# Patient Record
Sex: Female | Born: 1955
Health system: Southern US, Community
[De-identification: ages and names within clinical notes are randomized; demographics above are authoritative.]

## PROBLEM LIST (undated history)

## (undated) DIAGNOSIS — M8580 Other specified disorders of bone density and structure, unspecified site: Secondary | ICD-10-CM

## (undated) DIAGNOSIS — N959 Unspecified menopausal and perimenopausal disorder: Secondary | ICD-10-CM

## (undated) DIAGNOSIS — R030 Elevated blood-pressure reading, without diagnosis of hypertension: Secondary | ICD-10-CM

## (undated) DIAGNOSIS — Z87898 Personal history of other specified conditions: Secondary | ICD-10-CM

## (undated) DIAGNOSIS — I499 Cardiac arrhythmia, unspecified: Secondary | ICD-10-CM

## (undated) DIAGNOSIS — R87629 Unspecified abnormal cytological findings in specimens from vagina: Secondary | ICD-10-CM

## (undated) DIAGNOSIS — N200 Calculus of kidney: Secondary | ICD-10-CM

## (undated) DIAGNOSIS — Z8669 Personal history of other diseases of the nervous system and sense organs: Secondary | ICD-10-CM

## (undated) HISTORY — DX: Calculus of kidney: N20.0

## (undated) HISTORY — PX: VAGINAL HYSTERECTOMY: SUR661

## (undated) HISTORY — DX: Unspecified menopausal and perimenopausal disorder: N95.9

## (undated) HISTORY — PX: BREAST BIOPSY: SHX20

## (undated) HISTORY — DX: Personal history of other diseases of the nervous system and sense organs: Z86.69

## (undated) HISTORY — DX: Other specified disorders of bone density and structure, unspecified site: M85.80

## (undated) HISTORY — DX: Personal history of other specified conditions: Z87.898

## (undated) HISTORY — DX: Elevated blood-pressure reading, without diagnosis of hypertension: R03.0

## (undated) HISTORY — DX: Unspecified abnormal cytological findings in specimens from vagina: R87.629

---

## 1997-12-18 ENCOUNTER — Ambulatory Visit (HOSPITAL_COMMUNITY): Admission: RE | Admit: 1997-12-18 | Discharge: 1997-12-18 | Payer: Self-pay | Admitting: *Deleted

## 1999-11-13 ENCOUNTER — Emergency Department (HOSPITAL_COMMUNITY): Admission: EM | Admit: 1999-11-13 | Discharge: 1999-11-13 | Payer: Self-pay | Admitting: Emergency Medicine

## 1999-11-13 ENCOUNTER — Encounter: Payer: Self-pay | Admitting: *Deleted

## 2000-07-28 ENCOUNTER — Other Ambulatory Visit: Admission: RE | Admit: 2000-07-28 | Discharge: 2000-07-28 | Payer: Self-pay | Admitting: Obstetrics & Gynecology

## 2001-01-22 ENCOUNTER — Encounter: Admission: RE | Admit: 2001-01-22 | Discharge: 2001-01-22 | Payer: Self-pay | Admitting: Obstetrics & Gynecology

## 2001-01-22 ENCOUNTER — Encounter: Payer: Self-pay | Admitting: Obstetrics & Gynecology

## 2003-02-07 ENCOUNTER — Other Ambulatory Visit: Admission: RE | Admit: 2003-02-07 | Discharge: 2003-02-07 | Payer: Self-pay | Admitting: Obstetrics & Gynecology

## 2004-11-27 ENCOUNTER — Other Ambulatory Visit: Admission: RE | Admit: 2004-11-27 | Discharge: 2004-11-27 | Payer: Self-pay | Admitting: Obstetrics & Gynecology

## 2004-12-26 ENCOUNTER — Encounter: Admission: RE | Admit: 2004-12-26 | Discharge: 2004-12-26 | Payer: Self-pay | Admitting: Obstetrics & Gynecology

## 2006-04-10 ENCOUNTER — Encounter: Admission: RE | Admit: 2006-04-10 | Discharge: 2006-04-10 | Payer: Self-pay | Admitting: Obstetrics & Gynecology

## 2007-04-20 ENCOUNTER — Encounter: Admission: RE | Admit: 2007-04-20 | Discharge: 2007-04-20 | Payer: Self-pay | Admitting: Obstetrics & Gynecology

## 2008-04-21 ENCOUNTER — Encounter: Admission: RE | Admit: 2008-04-21 | Discharge: 2008-04-21 | Payer: Self-pay | Admitting: Obstetrics & Gynecology

## 2008-06-30 ENCOUNTER — Emergency Department (HOSPITAL_BASED_OUTPATIENT_CLINIC_OR_DEPARTMENT_OTHER): Admission: EM | Admit: 2008-06-30 | Discharge: 2008-06-30 | Payer: Self-pay | Admitting: Emergency Medicine

## 2008-06-30 ENCOUNTER — Ambulatory Visit (HOSPITAL_BASED_OUTPATIENT_CLINIC_OR_DEPARTMENT_OTHER): Admission: RE | Admit: 2008-06-30 | Discharge: 2008-06-30 | Payer: Self-pay | Admitting: Family Medicine

## 2009-04-25 ENCOUNTER — Encounter: Admission: RE | Admit: 2009-04-25 | Discharge: 2009-04-25 | Payer: Self-pay | Admitting: Obstetrics & Gynecology

## 2010-04-30 ENCOUNTER — Encounter: Admission: RE | Admit: 2010-04-30 | Discharge: 2010-04-30 | Payer: Self-pay | Admitting: Obstetrics & Gynecology

## 2010-11-23 ENCOUNTER — Encounter: Payer: Self-pay | Admitting: Obstetrics & Gynecology

## 2011-05-08 ENCOUNTER — Other Ambulatory Visit: Payer: Self-pay | Admitting: Obstetrics & Gynecology

## 2011-05-08 DIAGNOSIS — Z1231 Encounter for screening mammogram for malignant neoplasm of breast: Secondary | ICD-10-CM

## 2011-06-06 ENCOUNTER — Ambulatory Visit
Admission: RE | Admit: 2011-06-06 | Discharge: 2011-06-06 | Disposition: A | Discharge status: Home / Self Care | Payer: BC Managed Care – PPO | Source: Ambulatory Visit | Attending: Obstetrics & Gynecology | Admitting: Obstetrics & Gynecology

## 2011-06-06 DIAGNOSIS — Z1231 Encounter for screening mammogram for malignant neoplasm of breast: Secondary | ICD-10-CM

## 2012-06-17 ENCOUNTER — Other Ambulatory Visit: Payer: Self-pay | Admitting: Obstetrics & Gynecology

## 2012-06-17 DIAGNOSIS — Z1231 Encounter for screening mammogram for malignant neoplasm of breast: Secondary | ICD-10-CM

## 2012-07-13 ENCOUNTER — Ambulatory Visit
Admission: RE | Admit: 2012-07-13 | Discharge: 2012-07-13 | Disposition: A | Discharge status: Home / Self Care | Payer: BC Managed Care – PPO | Source: Ambulatory Visit | Attending: Obstetrics & Gynecology | Admitting: Obstetrics & Gynecology

## 2012-07-13 DIAGNOSIS — Z1231 Encounter for screening mammogram for malignant neoplasm of breast: Secondary | ICD-10-CM

## 2013-06-14 ENCOUNTER — Other Ambulatory Visit: Payer: Self-pay

## 2013-06-14 DIAGNOSIS — Z1231 Encounter for screening mammogram for malignant neoplasm of breast: Secondary | ICD-10-CM

## 2013-07-25 ENCOUNTER — Ambulatory Visit: Payer: BC Managed Care – PPO

## 2013-07-28 ENCOUNTER — Ambulatory Visit
Admission: RE | Admit: 2013-07-28 | Discharge: 2013-07-28 | Disposition: A | Discharge status: Home / Self Care | Payer: BC Managed Care – PPO | Source: Ambulatory Visit

## 2013-07-28 DIAGNOSIS — Z1231 Encounter for screening mammogram for malignant neoplasm of breast: Secondary | ICD-10-CM

## 2013-08-08 ENCOUNTER — Other Ambulatory Visit: Payer: Self-pay | Admitting: Obstetrics & Gynecology

## 2013-08-08 DIAGNOSIS — R928 Other abnormal and inconclusive findings on diagnostic imaging of breast: Secondary | ICD-10-CM

## 2013-08-09 ENCOUNTER — Ambulatory Visit
Admission: RE | Admit: 2013-08-09 | Discharge: 2013-08-09 | Disposition: A | Discharge status: Home / Self Care | Payer: BC Managed Care – PPO | Source: Ambulatory Visit | Attending: Obstetrics & Gynecology | Admitting: Obstetrics & Gynecology

## 2013-08-09 DIAGNOSIS — R928 Other abnormal and inconclusive findings on diagnostic imaging of breast: Secondary | ICD-10-CM

## 2014-01-17 ENCOUNTER — Other Ambulatory Visit: Payer: Self-pay | Admitting: Obstetrics & Gynecology

## 2014-01-17 DIAGNOSIS — N6310 Unspecified lump in the right breast, unspecified quadrant: Secondary | ICD-10-CM

## 2014-02-13 ENCOUNTER — Ambulatory Visit
Admission: RE | Admit: 2014-02-13 | Discharge: 2014-02-13 | Disposition: A | Discharge status: Home / Self Care | Payer: BC Managed Care – PPO | Source: Ambulatory Visit | Attending: Obstetrics & Gynecology | Admitting: Obstetrics & Gynecology

## 2014-02-13 DIAGNOSIS — N6310 Unspecified lump in the right breast, unspecified quadrant: Secondary | ICD-10-CM

## 2014-08-01 ENCOUNTER — Other Ambulatory Visit: Payer: Self-pay

## 2014-08-01 DIAGNOSIS — Z1231 Encounter for screening mammogram for malignant neoplasm of breast: Secondary | ICD-10-CM

## 2014-08-22 ENCOUNTER — Ambulatory Visit
Admission: RE | Admit: 2014-08-22 | Discharge: 2014-08-22 | Disposition: A | Discharge status: Home / Self Care | Payer: BC Managed Care – PPO | Source: Ambulatory Visit

## 2014-08-22 DIAGNOSIS — Z1231 Encounter for screening mammogram for malignant neoplasm of breast: Secondary | ICD-10-CM

## 2015-08-02 ENCOUNTER — Other Ambulatory Visit: Payer: Self-pay

## 2015-08-02 DIAGNOSIS — Z1231 Encounter for screening mammogram for malignant neoplasm of breast: Secondary | ICD-10-CM

## 2015-08-28 ENCOUNTER — Ambulatory Visit: Payer: Self-pay

## 2015-09-12 ENCOUNTER — Ambulatory Visit: Payer: Self-pay | Admitting: Cardiology

## 2015-09-14 ENCOUNTER — Encounter: Payer: Self-pay | Admitting: Cardiology

## 2015-09-20 ENCOUNTER — Encounter: Payer: Self-pay | Admitting: Cardiology

## 2015-09-20 ENCOUNTER — Ambulatory Visit (INDEPENDENT_AMBULATORY_CARE_PROVIDER_SITE_OTHER): Payer: 59 | Admitting: Cardiology

## 2015-09-20 VITALS — BP 132/82 | HR 68 | Ht 66.0 in | Wt 147.0 lb

## 2015-09-20 DIAGNOSIS — I471 Supraventricular tachycardia: Secondary | ICD-10-CM

## 2015-09-20 NOTE — Progress Notes (Signed)
Electrophysiology Office Note   Date:  09/20/2015   ID:  JOSY PEADEN, DOB 01/07/1956, MRN [id number]  PCP:  Lenora Boys, MD  Primary Electrophysiologist:  Regan Lemming, MD    Chief Complaint  Patient presents with  . Tachycardia  . New Patient (Initial Visit)     History of Present Illness: Andrea Mendoza is a 59 y.o. female who presents today for electrophysiology evaluation.   She presents today with multiple episodes of rapid heart beat and tachycardia. She has taken 25 mg of metoprolol which did not make a difference. She was seen in the [facility] emergency room with a recent prolonged episode.  Her first episode of tachycardia was 3-4 years ago. She says that she was eating dinner with friends in a stressful situation started to feel bad and woke up as they were putting her on the gurney going to the emergency room. She had another episode a few months later and has had episodes of this every 3-4 months since then. She has taken metoprolol as rhythm control when she has one of the episodes but does not take it on a daily basis. She has cut out caffeine as well as some of the vitamins that she takes. On August 24, she had an episode where she was in the pool with friends. She went to the emergency room where it took them to a half hours, according to her, to get her out of this rhythm.  Today, she denies symptoms of palpitations, chest pain, shortness of breath, orthopnea, PND, lower extremity edema, claudication, dizziness, presyncope, syncope, bleeding, or neurologic sequela. The patient is tolerating medications without difficulties and is otherwise without complaint today.    Past Medical History  Diagnosis Date  . Post menopausal problems   . Osteopenia   . Transient hypertension   . History of fibrocystic disease of breast   . Hx of migraines   . Kidney stones   . Abnormal vaginal Pap smear    Past Surgical History  Procedure Laterality Date  . Vaginal  hysterectomy       Current Outpatient Prescriptions  Medication Sig Dispense Refill  . Hormone Cream Base CREA by Does not apply route. Use as directed    . metoprolol (LOPRESSOR) 50 MG tablet Take 25 mg by mouth daily as needed. Take one-half tablet daily PRN    . Probiotic Product (PROBIOTIC ADVANCED PO) Take 1 capsule by mouth daily.    . progesterone (PROMETRIUM) 100 MG capsule Take 100 mg by mouth daily.     No current facility-administered medications for this visit.    Allergies:   Review of patient's allergies indicates no known allergies.   Social History:  The patient  reports that she has never smoked. She does not have any smokeless tobacco history on file. She reports that she drinks alcohol. She reports that she does not use illicit drugs.   Family History:  The patient's family history includes Breast cancer in an other family member; CAD in some other family members; CVA in some other family members; Colon polyps in an other family member; Dementia in an other family member; Heart attack in her father and mother; Osteoporosis in an other family member; Stroke in an other family member.    ROS:  Please see the history of present illness.   Otherwise, review of systems is positive for palpitations.   All other systems are reviewed and negative.    PHYSICAL EXAM: VS:  BP 132/82 mmHg  Pulse 68  Ht 5\' 6"  (1.676 m)  Wt 147 lb (66.679 kg)  BMI 23.74 kg/m2 , BMI Body mass index is 23.74 kg/(m^2). GEN: Well nourished, well developed, in no acute distress HEENT: normal Neck: no JVD, carotid bruits, or masses Cardiac: RRR; no murmurs, rubs, or gallops,no edema  Respiratory:  clear to auscultation bilaterally, normal work of breathing GI: soft, nontender, nondistended, + BS MS: no deformity or atrophy Skin: warm and dry Neuro:  Strength and sensation are intact Psych: euthymic mood, full affect  EKG:  EKG is ordered today. The ekg ordered today shows sinus rhythm, rate  68  Recent Labs: No results found for requested labs within last 365 days.    Lipid Panel  No results found for: CHOL, TRIG, HDL, CHOLHDL, VLDL, LDLCALC, LDLDIRECT   Wt Readings from Last 3 Encounters:  09/20/15 147 lb (66.679 kg)   ASSESSMENT AND PLAN:  1.  Palpitations: [facility] shows a narrow complex tachycardia with no obvious P waves likely to be AVNRT.  I discussed with her the possibility of ablation versus medical management. I discussed the risks of ablation including bleeding, infection, stroke, heart block, among others.  She would like to discuss the procedure with her husband.  Marlee Armenteros call the patient back to have her procedure scheduled if she wishes to have it done.  Current medicines are reviewed at length with the patient today.   The patient has concerns regarding her medicines.  The following changes were made today:  none  Labs/ tests ordered today include:  No orders of the defined types were placed in this encounter.     Disposition:   FU with Maelle Sheaffer pending procedure  Signed, Dearra Myhand Jorja LoaMartin Brendyn Mclaren, MD  09/20/2015 9:33 AM     [facility] [address] [place] [address] [phone] (office) [phone] (fax)

## 2015-09-24 ENCOUNTER — Telehealth: Payer: Self-pay | Admitting: Cardiology

## 2015-09-24 DIAGNOSIS — Z01812 Encounter for preprocedural laboratory examination: Secondary | ICD-10-CM

## 2015-09-24 DIAGNOSIS — I471 Supraventricular tachycardia: Secondary | ICD-10-CM

## 2015-09-24 NOTE — Telephone Encounter (Signed)
New MEssage  Pt requests to speak w/ RN concerning scheduling a procedure. Please call back and discuss.

## 2015-09-24 NOTE — Telephone Encounter (Signed)
F/u ° ° ° ° ° ° ° °Pt returning Sherri's phone call. °

## 2015-09-25 NOTE — Telephone Encounter (Signed)
F/u  Pt returning Rn phone call./  

## 2015-09-25 NOTE — Telephone Encounter (Signed)
Follow up    Patient calling wants to know has the ablation been set up yet

## 2015-09-25 NOTE — Telephone Encounter (Signed)
Informed patient that first available day is 11/12/15. She would like to schedule this and wants to be added to wait list if sooner date/time becomes available.  She would really like to go this year if possible.  She is aware I will monitor this and call her if opening becomes available. Aware that I will call her next week to review instructions.

## 2015-09-26 ENCOUNTER — Encounter: Payer: Self-pay | Admitting: *Deleted

## 2015-09-26 NOTE — Telephone Encounter (Signed)
Informed patient that I do not have a sooner date. She understands I will keep her on waiting list if sooner date becomes available. Reviewed instructions with patient, mailed letter of instructions to home address. Pre procedure labs scheduled for 11/09/15. Will have scheduler arrange post ablation f/u. Patient verbalized understanding and agreeable to plan.

## 2015-11-02 ENCOUNTER — Telehealth: Payer: Self-pay | Admitting: Cardiology

## 2015-11-02 NOTE — Telephone Encounter (Signed)
NewMessage  Pt stated she wanted to cancel ablation  For 11/11/14 and postpone for now. Please call back and discuss.

## 2015-11-02 NOTE — Telephone Encounter (Signed)
Patient has not experienced any further episodes since August. She does not want invasive procedure at this time.  She is going to monitor herself and call us if she would like to reconsider need for ablation. States she will call office if she needs to be seen again.

## 2015-11-09 ENCOUNTER — Other Ambulatory Visit: Payer: 59

## 2015-11-12 ENCOUNTER — Encounter (HOSPITAL_COMMUNITY): Admission: RE | Payer: Self-pay | Source: Ambulatory Visit

## 2015-11-12 ENCOUNTER — Ambulatory Visit (HOSPITAL_COMMUNITY): Admission: RE | Admit: 2015-11-12 | Payer: 59 | Source: Ambulatory Visit

## 2015-11-12 SURGERY — A-FLUTTER/A-TACH/SVT ABLATION

## 2015-11-26 ENCOUNTER — Ambulatory Visit
Admission: RE | Admit: 2015-11-26 | Discharge: 2015-11-26 | Disposition: A | Discharge status: Home / Self Care | Payer: BLUE CROSS/BLUE SHIELD | Source: Ambulatory Visit

## 2015-11-26 DIAGNOSIS — Z1231 Encounter for screening mammogram for malignant neoplasm of breast: Secondary | ICD-10-CM

## 2016-10-24 ENCOUNTER — Other Ambulatory Visit: Payer: Self-pay | Admitting: Family Medicine

## 2016-10-24 DIAGNOSIS — Z1231 Encounter for screening mammogram for malignant neoplasm of breast: Secondary | ICD-10-CM

## 2016-11-26 ENCOUNTER — Ambulatory Visit
Admission: RE | Admit: 2016-11-26 | Discharge: 2016-11-26 | Disposition: A | Discharge status: Home / Self Care | Payer: BLUE CROSS/BLUE SHIELD | Source: Ambulatory Visit | Attending: Family Medicine | Admitting: Family Medicine

## 2016-11-26 DIAGNOSIS — Z1231 Encounter for screening mammogram for malignant neoplasm of breast: Secondary | ICD-10-CM

## 2017-05-18 ENCOUNTER — Telehealth: Payer: Self-pay | Admitting: Cardiology

## 2017-05-18 NOTE — Telephone Encounter (Signed)
Dr.Camnitz this patient saw you back in 2016 and she is wanting to see Dr.Hochrein.     Thanks

## 2017-05-18 NOTE — Telephone Encounter (Signed)
OK with me.

## 2017-05-18 NOTE — Telephone Encounter (Signed)
That would be fine with me.  

## 2017-05-20 ENCOUNTER — Telehealth: Payer: Self-pay | Admitting: Cardiology

## 2017-05-20 NOTE — Telephone Encounter (Signed)
Received records from [facility] for appointment on 06/25/17 with Dr Antoine PocheHochrein.  Records put with Dr Hochrein's schedule for 06/25/17. lp

## 2017-06-08 ENCOUNTER — Encounter: Payer: Self-pay | Admitting: *Deleted

## 2017-06-15 ENCOUNTER — Encounter: Payer: Self-pay | Admitting: *Deleted

## 2017-06-24 NOTE — Progress Notes (Signed)
Cardiology Office Note   Date:  06/27/2017   ID:  Kaylen, Motl 09-17-1956, MRN [id number]  PCP:  Marinda Elk, MD  Cardiologist:   Rollene Rotunda, MD  Referring:  Marinda Elk, MD  Chief Complaint  Patient presents with  . SVT      History of Present Illness: JAYCIE KREGEL is a 61 y.o. female who is referred by Marinda Elk, MD for evaluation of SVT.   She was previously seen by Dr. [name] a few years ago.  She was thought to have AVNRT.   She has been treated with beta blocker.  She's had several episodes of this in years. This started about 6 or 7 years ago. However, she's been having it more frequently now with episodes in May and June.  She feels this as light headedness followed by increased HR and she gets an increased BP.  It might go on for 3 hours.  She tries to break it but that doesn't always work.  She has tried Valsalva.  She otherwise does well.  The patient denies any new symptoms such as chest discomfort, neck or arm discomfort. There has been no new shortness of breath, PND or orthopnea. There have been no reported palpitations, presyncope or syncope.  She does do some exercising.      Was able to review records from her primary provider and there was a narrow complex tachycardia identified on 79 with a rate of 156. Appears short RP.  Past Medical History:  Diagnosis Date  . Abnormal vaginal Pap smear   . History of fibrocystic disease of breast   . Hx of migraines   . Kidney stones   . Osteopenia   . Post menopausal problems   . Transient hypertension     Past Surgical History:  Procedure Laterality Date  . VAGINAL HYSTERECTOMY       Current Outpatient Prescriptions  Medication Sig Dispense Refill  . Hormone Cream Base CREA by Does not apply route. Use as directed    . Nutritional Supplements (DHEA PO) Take 1 capsule by mouth daily.    . Probiotic Product (PROBIOTIC ADVANCED PO) Take 1 capsule by mouth daily.    . progesterone (PROMETRIUM) 100  MG capsule Take 100 mg by mouth daily.    . metoprolol tartrate (LOPRESSOR) 25 MG tablet Take 0.5 tablets (12.5 mg total) by mouth 2 (two) times daily. 30 tablet 6   No current facility-administered medications for this visit.     Allergies:   Patient has no known allergies.    ROS:  Please see the history of present illness.   Otherwise, review of systems are positive for none.   All other systems are reviewed and negative.    PHYSICAL EXAM: VS:  BP 132/88 (BP Location: Left Arm, Patient Position: Sitting, Cuff Size: Normal)   Pulse 70   Ht 5\' 6"  (1.676 m)   Wt 157 lb 12.8 oz (71.6 kg)   BMI 25.47 kg/m  , BMI Body mass index is 25.47 kg/m. GENERAL:  Well appearing HEENT:  Pupils equal round and reactive, fundi not visualized, oral mucosa unremarkable NECK:  No jugular venous distention, waveform within normal limits, carotid upstroke brisk and symmetric, no bruits, no thyromegaly LYMPHATICS:  No cervical, inguinal adenopathy LUNGS:  Clear to auscultation bilaterally BACK:  No CVA tenderness CHEST:  Unremarkable HEART:  PMI not displaced or sustained,S1 and S2 within normal limits, no S3, no S4, no clicks, no rubs, no  murmurs ABD:  Flat, positive bowel sounds normal in frequency in pitch, no bruits, no rebound, no guarding, no midline pulsatile mass, no hepatomegaly, no splenomegaly EXT:  2 plus pulses throughout, no edema, no cyanosis no clubbing SKIN:  No rashes no nodules NEURO:  Cranial nerves II through XII grossly intact, motor grossly intact throughout PSYCH:  Cognitively intact, oriented to person place and time   EKG:  EKG is ordered today. The ekg ordered today demonstrates sinus rhythm, rate 70, axis within normal limits, intervals within normal limits, no acute ST-T wave changes.   Recent Labs: No results found for requested labs within last 8760 hours.    Lipid Panel No results found for: CHOL, TRIG, HDL, CHOLHDL, VLDL, LDLCALC, LDLDIRECT    Wt Readings  from Last 3 Encounters:  06/25/17 157 lb 12.8 oz (71.6 kg)  09/20/15 147 lb (66.7 kg)      Other studies Reviewed: Additional studies/ records that were reviewed today include: Office records.  . Review of the above records demonstrates:  Please see elsewhere in the note.     ASSESSMENT AND PLAN:   AVNRT:  We had a long discussion about this. At this point she is not prepared to have ablation. We discussed instead medical management. She's only been taking her beta blocker as needed. Therefore, she is going to start taking a low dose 12.5 mg twice daily and see if she has any breakthroughs. We talked about when necessary dosing as well. We talked about other vagal maneuvers. She would consider ablation if she has any recurrent or increased. Of note she did have recent labs within normal TSH.    Current medicines are reviewed at length with the patient today.  The patient does not have concerns regarding medicines.  The following changes have been made:  no change  Labs/ tests ordered today include: None  Orders Placed This Encounter  Procedures  . EKG 12-Lead     Disposition:   FU with me in 3 months.     Signed, Rollene Rotunda, MD  06/27/2017 9:57 AM    [facility]

## 2017-06-25 ENCOUNTER — Encounter: Payer: Self-pay | Admitting: Cardiology

## 2017-06-25 ENCOUNTER — Ambulatory Visit (INDEPENDENT_AMBULATORY_CARE_PROVIDER_SITE_OTHER): Payer: BLUE CROSS/BLUE SHIELD | Admitting: Cardiology

## 2017-06-25 VITALS — BP 132/88 | HR 70 | Ht 66.0 in | Wt 157.8 lb

## 2017-06-25 DIAGNOSIS — I471 Supraventricular tachycardia: Secondary | ICD-10-CM

## 2017-06-25 MED ORDER — METOPROLOL TARTRATE 25 MG PO TABS: 12.5000 mg | ORAL_TABLET | Freq: Two times a day (BID) | ORAL | 6 refills | Status: DC

## 2017-06-25 NOTE — Patient Instructions (Signed)
Medication Instructions:  START: Metoprolol 12.5 mg twice a day  Labwork: None Ordered  Testing/Procedures: None ordered  Follow-Up: Your physician recommends that you schedule a follow-up appointment in: 3 Months.   Any Other Special Instructions Will Be Listed Below (If Applicable).   If you need a refill on your cardiac medications before your next appointment, please call your pharmacy.

## 2017-06-27 ENCOUNTER — Encounter: Payer: Self-pay | Admitting: Cardiology

## 2017-09-11 ENCOUNTER — Other Ambulatory Visit: Payer: Self-pay | Admitting: Family Medicine

## 2017-09-11 DIAGNOSIS — Z1231 Encounter for screening mammogram for malignant neoplasm of breast: Secondary | ICD-10-CM

## 2017-09-28 NOTE — Progress Notes (Signed)
Cardiology Office Note   Date:  09/29/2017   ID:  Andrea Mendoza, DOB 08/08/1956, MRN [id number]  PCP:  Joycelyn RuaMeyers, Stephen, MD  Cardiologist:   Rollene RotundaJames Kamani Lewter, MD  Referring:  Joycelyn RuaMeyers, Stephen, MD   Chief Complaint  Patient presents with  . Palpitations      History of Present Illness: Andrea Mendoza is a 61 y.o. female who is referred by Joycelyn RuaMeyers, Stephen, MD for evaluation of SVT.  Was able to review records from her primary provider and there was a narrow complex tachycardia identified on 79 with a rate of 156. Appears short RP.  She had one episode of November 15 of heart rate in the 150s.  It did not break with vagal maneuvers.  She did take 37.5 mg of metoprolol and her episodes only lasted 45 minutes which was much shorter than previous.  She is only been taking the p.m. dose of beta-blocker because the a.m. dose was making her tired.  She otherwise has had no new cardiovascular complaints.  She is not having any presyncope or syncope.  She denies any chest pressure, neck or arm discomfort.  She had no new edema.   Past Medical History:  Diagnosis Date  . Abnormal vaginal Pap smear   . History of fibrocystic disease of breast   . Hx of migraines   . Kidney stones   . Osteopenia   . Post menopausal problems   . Transient hypertension     Past Surgical History:  Procedure Laterality Date  . VAGINAL HYSTERECTOMY       Current Outpatient Medications  Medication Sig Dispense Refill  . Hormone Cream Base CREA Use as directed, PATIENT DO NOT KNOW DOSAGE    . metoprolol tartrate (LOPRESSOR) 25 MG tablet Take 0.5 tablets (12.5 mg total) by mouth 2 (two) times daily. 30 tablet 6  . Nutritional Supplements (DHEA PO) Take 1 capsule by mouth daily.    . Probiotic Product (PROBIOTIC ADVANCED PO) Take 1 capsule by mouth daily.    . progesterone (PROMETRIUM) 100 MG capsule Take 100 mg by mouth daily.     No current facility-administered medications for this visit.     Allergies:    Patient has no known allergies.    ROS:  Please see the history of present illness.   Otherwise, review of systems are positive for none.   All other systems are reviewed and negative.    PHYSICAL EXAM: VS:  BP 104/64   Pulse 64   Ht 5\' 6"  (1.676 m)   Wt 156 lb (70.8 kg)   SpO2 97%   BMI 25.18 kg/m  , BMI Body mass index is 25.18 kg/m.  GENERAL:  Well appearing NECK:  No jugular venous distention, waveform within normal limits, carotid upstroke brisk and symmetric, no bruits, no thyromegaly LUNGS:  Clear to auscultation bilaterally CHEST:  Unremarkable HEART:  PMI not displaced or sustained,S1 and S2 within normal limits, no S3, no S4, no clicks, no rubs, no murmurs ABD:  Flat, positive bowel sounds normal in frequency in pitch, no bruits, no rebound, no guarding, no midline pulsatile mass, no hepatomegaly, no splenomegaly EXT:  2 plus pulses throughout, no edema, no cyanosis no clubbing   EKG:  EKG is not ordered today.    Recent Labs: No results found for requested labs within last 8760 hours.    Lipid Panel No results found for: CHOL, TRIG, HDL, CHOLHDL, VLDL, LDLCALC, LDLDIRECT    Wt Readings from  Last 3 Encounters:  09/29/17 156 lb (70.8 kg)  06/25/17 157 lb 12.8 oz (71.6 kg)  09/20/15 147 lb (66.7 kg)      Other studies Reviewed: Additional studies/ records that were reviewed today include:  Labs Review of the above records demonstrates:  See below   ASSESSMENT AND PLAN:   AVNRT:  Since she had a shorter episode after he took the beta-blocker she is now going to try this is a pill in pocket ablation.  Approach and if she fails this then we would consider ablation.  DYSLIPIDEMIA: Her LDL recently was 189 with an HDL of 53.  Her 10-year cardiovascular risk is still only 2%.  She is close to needing a statin given the absolute LDL and I think getting a coronary calcium score will help influence her decision.  Regardless she needs diet and lifestyle  changes.  Current medicines are reviewed at length with the patient today.  The patient does not have concerns regarding medicines.  The following changes have been made:  None  Labs/ tests ordered today include:   Orders Placed This Encounter  Procedures  . CT CARDIAC SCORING     Disposition:   FU with me in 6 months.     Signed, Rollene RotundaJames Candela Krul, MD  09/29/2017 2:56 PM    [facility]

## 2017-09-29 ENCOUNTER — Encounter: Payer: Self-pay | Admitting: Cardiology

## 2017-09-29 ENCOUNTER — Ambulatory Visit (INDEPENDENT_AMBULATORY_CARE_PROVIDER_SITE_OTHER): Payer: BLUE CROSS/BLUE SHIELD | Admitting: Cardiology

## 2017-09-29 VITALS — BP 104/64 | HR 64 | Ht 66.0 in | Wt 156.0 lb

## 2017-09-29 DIAGNOSIS — I471 Supraventricular tachycardia: Secondary | ICD-10-CM | POA: Diagnosis not present

## 2017-09-29 DIAGNOSIS — R0602 Shortness of breath: Secondary | ICD-10-CM | POA: Diagnosis not present

## 2017-09-29 NOTE — Patient Instructions (Signed)
Medication Instructions:  Continue current medications  If you need a refill on your cardiac medications before your next appointment, please call your pharmacy.  Labwork: None Ordered  Testing/Procedures: Your physician has requested that you have a Coronary Calcium Score. This test is done at our [address] [address] [facility]. [address].   Follow-Up: Your physician wants you to follow-up in: 6 Months. You should receive a reminder letter in the mail two months in advance. If you do not receive a letter, please call our office [phone].    Thank you for choosing [facility]!!

## 2017-10-07 ENCOUNTER — Other Ambulatory Visit: Payer: BLUE CROSS/BLUE SHIELD

## 2017-10-14 ENCOUNTER — Ambulatory Visit (INDEPENDENT_AMBULATORY_CARE_PROVIDER_SITE_OTHER)
Admission: RE | Admit: 2017-10-14 | Discharge: 2017-10-14 | Disposition: A | Discharge status: Home / Self Care | Payer: Self-pay | Source: Ambulatory Visit | Attending: Cardiology | Admitting: Cardiology

## 2017-10-14 DIAGNOSIS — R0602 Shortness of breath: Secondary | ICD-10-CM

## 2017-10-16 ENCOUNTER — Encounter: Payer: Self-pay | Admitting: Cardiology

## 2017-10-19 ENCOUNTER — Telehealth: Payer: Self-pay | Admitting: Cardiology

## 2017-10-19 DIAGNOSIS — R931 Abnormal findings on diagnostic imaging of heart and coronary circulation: Secondary | ICD-10-CM

## 2017-10-19 DIAGNOSIS — E785 Hyperlipidemia, unspecified: Secondary | ICD-10-CM

## 2017-10-19 MED ORDER — ATORVASTATIN CALCIUM 40 MG PO TABS: 40.0000 mg | ORAL_TABLET | Freq: Every day | ORAL | 3 refills | Status: DC

## 2017-10-19 NOTE — Telephone Encounter (Signed)
Spoke with pt letting her know Atorvastatin was send in her pharmacy and GXT or and someone will called her and schedule for GXT, fasting lipids mailed to pt to get done in 8 weeks..Marland Kitchen

## 2017-10-19 NOTE — Telephone Encounter (Signed)
Mrs. Andrea Mendoza is calling about a prescription for Lipitor . She wants to know if it has been sent to her pharmacy , also she has questions about some other things that Dr. Antoine PocheHochrein wanted . Please call   Thanks

## 2017-10-19 NOTE — Telephone Encounter (Signed)
-----   Message from Rollene RotundaJames Hochrein, MD sent at 10/16/2017  2:50 PM EST ----- Discussed with the patient.  Needs Lipitor 40 mg daily disp number 30 with 11 refills.  Repeat lipid and liver in 8 weeks.  Schedule for POET ([employer] Exercise Treadmill).  Please call her to tell her the tests have been scheduled.  Send results to Joycelyn RuaMeyers, Stephen, MD

## 2017-10-19 NOTE — Telephone Encounter (Signed)
Forward to nya 

## 2017-10-21 ENCOUNTER — Telehealth (HOSPITAL_COMMUNITY): Payer: Self-pay

## 2017-10-21 NOTE — Telephone Encounter (Signed)
Encounter complete. 

## 2017-10-22 ENCOUNTER — Telehealth (HOSPITAL_COMMUNITY): Payer: Self-pay

## 2017-10-22 NOTE — Telephone Encounter (Signed)
Encounter complete. 

## 2017-10-23 ENCOUNTER — Inpatient Hospital Stay (HOSPITAL_COMMUNITY): Admission: RE | Admit: 2017-10-23 | Payer: BLUE CROSS/BLUE SHIELD | Source: Ambulatory Visit

## 2017-10-29 ENCOUNTER — Ambulatory Visit (HOSPITAL_COMMUNITY)
Admission: RE | Admit: 2017-10-29 | Discharge: 2017-10-29 | Disposition: A | Discharge status: Home / Self Care | Payer: BLUE CROSS/BLUE SHIELD | Source: Ambulatory Visit | Attending: Cardiology | Admitting: Cardiology

## 2017-10-29 ENCOUNTER — Encounter (INDEPENDENT_AMBULATORY_CARE_PROVIDER_SITE_OTHER): Payer: Self-pay

## 2017-10-29 DIAGNOSIS — R931 Abnormal findings on diagnostic imaging of heart and coronary circulation: Secondary | ICD-10-CM | POA: Diagnosis not present

## 2017-10-29 DIAGNOSIS — R9439 Abnormal result of other cardiovascular function study: Secondary | ICD-10-CM | POA: Insufficient documentation

## 2017-10-29 LAB — EXERCISE TOLERANCE TEST
CSEPEDS: 2 s
CSEPEW: 10.1 METS
CSEPHR: 101 %
CSEPPHR: 160 {beats}/min
Exercise duration (min): 9 min
MPHR: 157 {beats}/min
RPE: 17
Rest HR: 65 {beats}/min

## 2017-10-30 ENCOUNTER — Encounter (HOSPITAL_COMMUNITY): Payer: BLUE CROSS/BLUE SHIELD

## 2017-10-30 ENCOUNTER — Telehealth: Payer: Self-pay | Admitting: Cardiology

## 2017-10-30 NOTE — Telephone Encounter (Signed)
Follow up with Dr. Antoine PocheHochrein.

## 2017-10-30 NOTE — Telephone Encounter (Signed)
-----   Message from Yvetta CoderPamela A Phillips sent at 10/30/2017  7:12 AM EST ----- Regarding: Abnormal ETT Abnormal ETT read by Dr Mayford Knifeurner on 10/29/17. This test was ordered by Dr Antoine PocheHochrein.

## 2017-10-30 NOTE — Telephone Encounter (Signed)
Patient of Dr. Antoine PocheHochrein who had GXT ordered/completed after abnormal coronary calcium scoring test. Patient is not scheduled for MD follow up.   Routed to DOD & primary cardiologist Dr. Antoine PocheHochrein for advice/FYI

## 2017-11-02 NOTE — Telephone Encounter (Signed)
Please call her and tell her that I looked at the stress test and it is negative for ischemia.  No further testing but I would be happy to see her back to discuss all of the results.

## 2017-11-02 NOTE — Telephone Encounter (Signed)
Patient has been made aware of results and verbalized her understanding. She does not require a follow up at this time.

## 2017-12-01 ENCOUNTER — Ambulatory Visit
Admission: RE | Admit: 2017-12-01 | Discharge: 2017-12-01 | Disposition: A | Discharge status: Home / Self Care | Payer: BLUE CROSS/BLUE SHIELD | Source: Ambulatory Visit | Attending: Family Medicine | Admitting: Family Medicine

## 2017-12-01 ENCOUNTER — Encounter (INDEPENDENT_AMBULATORY_CARE_PROVIDER_SITE_OTHER): Payer: Self-pay

## 2017-12-01 DIAGNOSIS — Z1231 Encounter for screening mammogram for malignant neoplasm of breast: Secondary | ICD-10-CM

## 2018-01-11 LAB — HM COLONOSCOPY

## 2018-04-22 ENCOUNTER — Encounter: Payer: Self-pay | Admitting: Cardiology

## 2018-04-22 NOTE — Progress Notes (Signed)
Cardiology Office Note   Date:  04/23/2018   ID:  Andrea Mendoza, Andrea Mendoza 08/04/1956, MRN [id number]  PCP:  Joycelyn Rua, MD  Cardiologist:   Rollene Rotunda, MD  Referring:  Joycelyn Rua, MD   Chief Complaint  Patient presents with  . Palpitations      History of Present Illness: Andrea Mendoza is a 62 y.o. female who is referred by Joycelyn Rua, MD for evaluation of SVT.  Was able to review records from her primary provider and there was a narrow complex tachycardia identified on 79 with a rate of 156. Appears short RP.  She had one episode of November 15 of heart rate in the 150s.  It did not break with vagal maneuvers.  She has had probably two episodes since I last saw her.  The longest lasted 2 1/2 hours.  She otherwise feels well. She has stress taking care of aged parents and in-laws.  She eats well and exercises and denies symptoms.    Past Medical History:  Diagnosis Date  . Abnormal vaginal Pap smear   . History of fibrocystic disease of breast   . Hx of migraines   . Kidney stones   . Osteopenia   . Post menopausal problems   . Transient hypertension     Past Surgical History:  Procedure Laterality Date  . BREAST BIOPSY Right   . VAGINAL HYSTERECTOMY       Current Outpatient Medications  Medication Sig Dispense Refill  . atorvastatin (LIPITOR) 40 MG tablet Take 1 tablet (40 mg total) by mouth daily. 90 tablet 3  . Hormone Cream Base CREA Use as directed, PATIENT DO NOT KNOW DOSAGE    . metoprolol tartrate (LOPRESSOR) 25 MG tablet Take 12.5 mg by mouth at bedtime.    . Nutritional Supplements (DHEA PO) Take 1 capsule by mouth daily.    . Probiotic Product (PROBIOTIC ADVANCED PO) Take 1 capsule by mouth daily.    . progesterone (PROMETRIUM) 100 MG capsule Take 100 mg by mouth daily.    Marland Kitchen BELVIQ XR 20 MG TB24 Take 1 tablet by mouth daily.  1   No current facility-administered medications for this visit.     Allergies:   Patient has no known allergies.      ROS:  Please see the history of present illness.   Otherwise, review of systems are positive for none.   All other systems are reviewed and negative.    PHYSICAL EXAM: VS:  BP 116/74   Pulse 61   Ht 5\' 6"  (1.676 m)   Wt 160 lb (72.6 kg)   BMI 25.82 kg/m  , BMI Body mass index is 25.82 kg/m.  GENERAL:  Well appearing NECK:  No jugular venous distention, waveform within normal limits, carotid upstroke brisk and symmetric, no bruits, no thyromegaly LUNGS:  Clear to auscultation bilaterally CHEST:  Unremarkable HEART:  PMI not displaced or sustained,S1 and S2 within normal limits, no S3, no S4, no clicks, no rubs, no murmurs ABD:  Flat, positive bowel sounds normal in frequency in pitch, no bruits, no rebound, no guarding, no midline pulsatile mass, no hepatomegaly, no splenomegaly EXT:  2 plus pulses throughout, no edema, no cyanosis no clubbing    EKG:  EKG is not ordered today.    Recent Labs: No results found for requested labs within last 8760 hours.    Lipid Panel No results found for: CHOL, TRIG, HDL, CHOLHDL, VLDL, LDLCALC, LDLDIRECT    Wt  Readings from Last 3 Encounters:  04/23/18 160 lb (72.6 kg)  09/29/17 156 lb (70.8 kg)  06/25/17 157 lb 12.8 oz (71.6 kg)      Other studies Reviewed: Additional studies/ records that were reviewed today include:  Labs Review of the above records demonstrates:  See below   ASSESSMENT AND PLAN:   AVNRT:  She does not want to consider ablation yet and will continue with beta blockers.  She understands vagal maneuvers.    DYSLIPIDEMIA: Her LDL recently was 189 with an HDL of 53.  She started Lipitor and now her LDL was 84 and HDL 48.  She will continue current therapy.   ELEVATED CORONARY CALCIUM:  She had a negative POET (Plain Old Exercise Treadmill). I will repeat this in one year.     Current medicines are reviewed at length with the patient today.  The patient does not have concerns regarding medicines.  The  following changes have been made:  None  Labs/ tests ordered today include: None  Orders Placed This Encounter  Procedures  . EXERCISE TOLERANCE TEST (ETT)     Disposition:   FU with me in 12 months after POET (Plain Old Exercise Treadmill).     Signed, Rollene RotundaJames Jaryiah Mehlman, MD  04/23/2018 9:44 AM    [facility]

## 2018-04-23 ENCOUNTER — Encounter: Payer: Self-pay | Admitting: Cardiology

## 2018-04-23 ENCOUNTER — Ambulatory Visit (INDEPENDENT_AMBULATORY_CARE_PROVIDER_SITE_OTHER): Payer: BLUE CROSS/BLUE SHIELD | Admitting: Cardiology

## 2018-04-23 VITALS — BP 116/74 | HR 61 | Ht 66.0 in | Wt 160.0 lb

## 2018-04-23 DIAGNOSIS — E785 Hyperlipidemia, unspecified: Secondary | ICD-10-CM | POA: Diagnosis not present

## 2018-04-23 DIAGNOSIS — I471 Supraventricular tachycardia: Secondary | ICD-10-CM

## 2018-04-23 DIAGNOSIS — R931 Abnormal findings on diagnostic imaging of heart and coronary circulation: Secondary | ICD-10-CM | POA: Diagnosis not present

## 2018-04-23 DIAGNOSIS — I4719 Other supraventricular tachycardia: Secondary | ICD-10-CM

## 2018-04-23 NOTE — Patient Instructions (Signed)
Medication Instructions:  Continue current medications  If you need a refill on your cardiac medications before your next appointment, please call your pharmacy.  Labwork: None Ordered   Testing/Procedures: Your physician has requested that you have an exercise tolerance test in 1 Year. For further information please visit [email or web address]. Please also follow instruction sheet, as given.  Follow-Up: Your physician wants you to follow-up in: 1 Year. You should receive a reminder letter in the mail two months in advance. If you do not receive a letter, please call our office [phone].      Thank you for choosing [facility] at [facility]!!

## 2018-05-17 ENCOUNTER — Other Ambulatory Visit: Payer: Self-pay

## 2018-05-17 ENCOUNTER — Telehealth: Payer: Self-pay | Admitting: Cardiology

## 2018-05-17 MED ORDER — METOPROLOL TARTRATE 25 MG PO TABS: 12.5000 mg | ORAL_TABLET | Freq: Every day | ORAL | 3 refills | Status: DC

## 2018-05-17 NOTE — Telephone Encounter (Signed)
Rx has been sent to the pharmacy electronically. ° °

## 2018-05-17 NOTE — Telephone Encounter (Signed)
°*  STAT* If patient is at the pharmacy, call can be transferred to refill team.   1. Which medications need to be refilled? (please list name of each medication and dose if known) Metoprolol 25mg    2. Which pharmacy/location (including street and city if local pharmacy) is medication to be sent to?[facility]  3. Do they need a 30 day or 90 day supply? 90

## 2018-09-16 ENCOUNTER — Telehealth: Payer: Self-pay | Admitting: Cardiology

## 2018-09-16 NOTE — Telephone Encounter (Signed)
New message   Patient states that she would like to schedule her ablation after the beginning of the year. Please call to discuss.

## 2018-09-16 NOTE — Telephone Encounter (Signed)
Spoke with patient of Dr. Antoine PocheHochrein who was last seen June 2019. She has seen Dr. Elberta Fortisamnitz 09/2015 and was scheduled for SVT ablation which she cancelled. She is now wishing to schedule this again after Dec 04 2018. Explained that she will need to be evaluated by an EP doctor prior (either Dr. Elberta Fortisamnitz or whomever Dr. Antoine PocheHochrein recommends as she requests his opinion) and then she will be scheduled for the procedure.   She states she feels she needs to have this done as she has had SVT episodes every 3-4 months in the past but had 3 episodes in September.   Message routed to Dr. Antoine PocheHochrein & Tamela OddiNya for review and advice.

## 2018-09-19 NOTE — Telephone Encounter (Signed)
OK to schedule an appt with Dr. Elberta Fortisamnitz.

## 2018-09-20 NOTE — Telephone Encounter (Signed)
Can you please schedule pt to see Dr Elberta Fortisamnitz

## 2018-09-29 ENCOUNTER — Telehealth: Payer: Self-pay | Admitting: Cardiology

## 2018-09-29 NOTE — Telephone Encounter (Signed)
09-22-18 lmm @ 1034am for pt to make an appt with Camnitz to re-discuss SVT ablation per Hochrein/mt 09-29-18 lmm @ 406pm for pt to make an appt with Camnitz to re-discuss SVT ablation per Hochrein/mt

## 2018-10-06 NOTE — Telephone Encounter (Signed)
appt made 12/08/2018 @ 10:00 am with Dr Elberta Fortisamnitz

## 2018-10-19 ENCOUNTER — Other Ambulatory Visit: Payer: Self-pay | Admitting: Cardiology

## 2018-10-20 ENCOUNTER — Encounter: Payer: Self-pay | Admitting: Internal Medicine

## 2018-10-20 ENCOUNTER — Ambulatory Visit (INDEPENDENT_AMBULATORY_CARE_PROVIDER_SITE_OTHER): Payer: Self-pay | Admitting: Internal Medicine

## 2018-10-20 VITALS — BP 120/76 | HR 50 | Temp 97.5°F | Ht 66.0 in | Wt 164.0 lb

## 2018-10-20 DIAGNOSIS — Z6828 Body mass index (BMI) 28.0-28.9, adult: Secondary | ICD-10-CM

## 2018-10-20 DIAGNOSIS — N951 Menopausal and female climacteric states: Secondary | ICD-10-CM

## 2018-10-20 NOTE — Progress Notes (Signed)
Subjective:     Patient ID: Andrea Mendoza , female    DOB: [birth year] , 62 y.o.   MRN: [id number]   Chief Complaint  Patient presents with  . Hormones f/u    HPI  She is here today for f/u BHRT. She reports compliance with her current regimen. She is not sure if her meds needs to be adjusted. She reports difficulty with weight loss. She also c/o bloating and full breasts. She admits that she does exercise regularly.     Past Medical History:  Diagnosis Date  . Abnormal vaginal Pap smear   . History of fibrocystic disease of breast   . Hx of migraines   . Kidney stones   . Osteopenia   . Post menopausal problems   . Transient hypertension      Family History  Problem Relation Age of Onset  . Heart attack Father   . Heart attack Mother   . Dementia Other   . CVA Other   . CAD Other   . Osteoporosis Other   . Stroke Other        X 2  . Colon polyps Other   . CVA Other   . CAD Other   . Breast cancer Other      Current Outpatient Medications:  .  atorvastatin (LIPITOR) 40 MG tablet, TAKE 1 TABLET BY MOUTH ONCE DAILY, Disp: 90 tablet, Rfl: 3 .  Cholecalciferol (VITAMIN D3 PO), Take by mouth., Disp: , Rfl:  .  Estradiol-Estriol-Progesterone (BIEST/PROGESTERONE) CREA, Place onto the skin. Pt states that they are separate compound prescriptions., Disp: , Rfl:  .  Hormone Cream Base CREA, Use as directed, PATIENT DO NOT KNOW DOSAGE, Disp: , Rfl:  .  metoprolol tartrate (LOPRESSOR) 25 MG tablet, Take 0.5 tablets (12.5 mg total) by mouth at bedtime., Disp: 45 tablet, Rfl: 3 .  Nutritional Supplements (DHEA PO), Take 1 capsule by mouth daily., Disp: , Rfl:  .  progesterone (PROMETRIUM) 100 MG capsule, Take 100 mg by mouth daily., Disp: , Rfl:  .  Probiotic Product (PROBIOTIC ADVANCED PO), Take 1 capsule by mouth daily., Disp: , Rfl:    No Known Allergies   Review of Systems  Constitutional: Negative.   Respiratory: Negative.   Cardiovascular: Negative.    Gastrointestinal: Negative.   Neurological: Negative.   Psychiatric/Behavioral: Negative.      Today's Vitals   10/20/18 1449  BP: 120/76  Pulse: (!) 50  Temp: (!) 97.5 F (36.4 C)  TempSrc: Oral  Weight: 164 lb (74.4 kg)  Height: 5' 6"  (1.676 m)   Body mass index is 26.47 kg/m.   Objective:  Physical Exam Vitals signs and nursing note reviewed.  Constitutional:      Appearance: Normal appearance.  HENT:     Head: Normocephalic and atraumatic.  Cardiovascular:     Rate and Rhythm: Normal rate and regular rhythm.     Heart sounds: Normal heart sounds.  Pulmonary:     Effort: Pulmonary effort is normal.     Breath sounds: Normal breath sounds.  Skin:    General: Skin is warm.  Neurological:     General: No focal deficit present.     Mental Status: She is alert.  Psychiatric:        Mood and Affect: Mood normal.         Assessment And Plan:     1. Female climacteric state  I plan to contact [facility] to adjust her meds  as follows: she will decrease to 1cc Biest. She will skip both Biest and progesterone on Saturdays and Sundays for now. After two weeks, she will let me know how she feels with this regimen. If she feels better, then I will further decrease her progesterone as well. All questions were answered to her satisfaction. She will rto in 4 months for re-evaluation. I plan to repeat a saliva kit at that time.   2. Adult BMI 28.0-28.9 kg/sq m  I think her weight will normalize once her hormones are balanced.   Maximino Greenland, MD ]

## 2018-10-20 NOTE — Patient Instructions (Signed)
Mediterranean Diet  A Mediterranean diet refers to food and lifestyle choices that are based on the traditions of countries located on the [place]. This way of eating has been shown to help prevent certain conditions and improve outcomes for people who have chronic diseases, like kidney disease and heart disease.  What are tips for following this plan?  Lifestyle   Cook and eat meals together with your family, when possible.   Drink enough fluid to keep your urine clear or pale yellow.   Be physically active every day. This includes:  ? Aerobic exercise like running or swimming.  ? Leisure activities like gardening, walking, or housework.   Get 7-8 hours of sleep each night.   If recommended by your health care provider, drink red wine in moderation. This means 1 glass a day for nonpregnant women and 2 glasses a day for men. A glass of wine equals 5 oz (150 mL).  Reading food labels     Check the serving size of packaged foods. For foods such as rice and pasta, the serving size refers to the amount of cooked product, not dry.   Check the total fat in packaged foods. Avoid foods that have saturated fat or trans fats.   Check the ingredients list for added sugars, such as corn syrup.  Shopping   At the grocery store, buy most of your food from the areas near the walls of the store. This includes:  ? Fresh fruits and vegetables (produce).  ? Grains, beans, nuts, and seeds. Some of these may be available in unpackaged forms or large amounts (in bulk).  ? Fresh seafood.  ? Poultry and eggs.  ? Low-fat dairy products.   Buy whole ingredients instead of prepackaged foods.   Buy fresh fruits and vegetables in-season from local farmers markets.   Buy frozen fruits and vegetables in resealable bags.   If you do not have access to quality fresh seafood, buy precooked frozen shrimp or canned fish, such as tuna, salmon, or sardines.   Buy small amounts of raw or cooked vegetables, salads, or olives from  the deli or salad bar at your store.   Stock your pantry so you always have certain foods on hand, such as olive oil, canned tuna, canned tomatoes, rice, pasta, and beans.  Cooking   Cook foods with extra-virgin olive oil instead of using butter or other vegetable oils.   Have meat as a side dish, and have vegetables or grains as your main dish. This means having meat in small portions or adding small amounts of meat to foods like pasta or stew.   Use beans or vegetables instead of meat in common dishes like chili or lasagna.   Experiment with different cooking methods. Try roasting or broiling vegetables instead of steaming or sauteing them.   Add frozen vegetables to soups, stews, pasta, or rice.   Add nuts or seeds for added healthy fat at each meal. You can add these to yogurt, salads, or vegetable dishes.   Marinate fish or vegetables using olive oil, lemon juice, garlic, and fresh herbs.  Meal planning     Plan to eat 1 vegetarian meal one day each week. Try to work up to 2 vegetarian meals, if possible.   Eat seafood 2 or more times a week.   Have healthy snacks readily available, such as:  ? Vegetable sticks with hummus.  ? Greek yogurt.  ? Fruit and nut trail mix.   Eat balanced   meals throughout the week. This includes:  ? Fruit: 2-3 servings a day  ? Vegetables: 4-5 servings a day  ? Low-fat dairy: 2 servings a day  ? Fish, poultry, or lean meat: 1 serving a day  ? Beans and legumes: 2 or more servings a week  ? Nuts and seeds: 1-2 servings a day  ? Whole grains: 6-8 servings a day  ? Extra-virgin olive oil: 3-4 servings a day   Limit red meat and sweets to only a few servings a month  What are my food choices?   Mediterranean diet  ? Recommended  ? Grains: Whole-grain pasta. Brown rice. Bulgar wheat. Polenta. Couscous. Whole-wheat bread. Oatmeal. Quinoa.  ? Vegetables: Artichokes. Beets. Broccoli. Cabbage. Carrots. Eggplant. Green beans. Chard. Kale. Spinach. Onions. Leeks. Peas. Squash.  Tomatoes. Peppers. Radishes.  ? Fruits: Apples. Apricots. Avocado. Berries. Bananas. Cherries. Dates. Figs. Grapes. Lemons. Melon. Oranges. Peaches. Plums. Pomegranate.  ? Meats and other protein foods: Beans. Almonds. Sunflower seeds. Pine nuts. Peanuts. Cod. Salmon. Scallops. Shrimp. Tuna. Tilapia. Clams. Oysters. Eggs.  ? Dairy: Low-fat milk. Cheese. Greek yogurt.  ? Beverages: Water. Red wine. Herbal tea.  ? Fats and oils: Extra virgin olive oil. Avocado oil. Grape seed oil.  ? Sweets and desserts: Greek yogurt with honey. Baked apples. Poached pears. Trail mix.  ? Seasoning and other foods: Basil. Cilantro. Coriander. Cumin. Mint. Parsley. Sage. Rosemary. Tarragon. Garlic. Oregano. Thyme. Pepper. Balsalmic vinegar. Tahini. Hummus. Tomato sauce. Olives. Mushrooms.  ? Limit these  ? Grains: Prepackaged pasta or rice dishes. Prepackaged cereal with added sugar.  ? Vegetables: Deep fried potatoes (french fries).  ? Fruits: Fruit canned in syrup.  ? Meats and other protein foods: Beef. Pork. Lamb. Poultry with skin. Hot dogs. Bacon.  ? Dairy: Ice cream. Sour cream. Whole milk.  ? Beverages: Juice. Sugar-sweetened soft drinks. Beer. Liquor and spirits.  ? Fats and oils: Butter. Canola oil. Vegetable oil. Beef fat (tallow). Lard.  ? Sweets and desserts: Cookies. Cakes. Pies. Candy.  ? Seasoning and other foods: Mayonnaise. Premade sauces and marinades.  ? The items listed may not be a complete list. Talk with your dietitian about what dietary choices are right for you.  Summary   The Mediterranean diet includes both food and lifestyle choices.   Eat a variety of fresh fruits and vegetables, beans, nuts, seeds, and whole grains.   Limit the amount of red meat and sweets that you eat.   Talk with your health care provider about whether it is safe for you to drink red wine in moderation. This means 1 glass a day for nonpregnant women and 2 glasses a day for men. A glass of wine equals 5 oz (150 mL).  This information  is not intended to replace advice given to you by your health care provider. Make sure you discuss any questions you have with your health care provider.  Document Released: 06/12/2016 Document Revised: 07/15/2016 Document Reviewed: 06/12/2016  Elsevier Interactive Patient Education  2019 Elsevier Inc[employer]

## 2018-11-01 ENCOUNTER — Encounter: Payer: Self-pay | Admitting: Internal Medicine

## 2018-11-07 ENCOUNTER — Encounter: Payer: Self-pay | Admitting: Internal Medicine

## 2018-11-09 ENCOUNTER — Other Ambulatory Visit: Payer: Self-pay | Admitting: Family Medicine

## 2018-11-09 DIAGNOSIS — Z1231 Encounter for screening mammogram for malignant neoplasm of breast: Secondary | ICD-10-CM

## 2018-11-14 ENCOUNTER — Encounter: Payer: Self-pay | Admitting: Internal Medicine

## 2018-11-23 ENCOUNTER — Encounter: Payer: Self-pay | Admitting: Cardiology

## 2018-12-08 ENCOUNTER — Ambulatory Visit (INDEPENDENT_AMBULATORY_CARE_PROVIDER_SITE_OTHER): Payer: Medicare Other | Admitting: Cardiology

## 2018-12-08 ENCOUNTER — Encounter: Payer: Self-pay | Admitting: Cardiology

## 2018-12-08 VITALS — BP 124/62 | HR 60 | Ht 66.0 in | Wt 162.0 lb

## 2018-12-08 DIAGNOSIS — I471 Supraventricular tachycardia: Secondary | ICD-10-CM

## 2018-12-08 DIAGNOSIS — Z01812 Encounter for preprocedural laboratory examination: Secondary | ICD-10-CM | POA: Diagnosis not present

## 2018-12-08 NOTE — Patient Instructions (Signed)
Medication Instructions:  Your physician recommends that you continue on your current medications as directed. Please refer to the Current Medication list given to you today.  * If you need a refill on your cardiac medications before your next appointment, please call your pharmacy.   Labwork: Pre procedure labs today: BMET & CBC *We will only notify you of abnormal results, otherwise continue current treatment plan.  Testing/Procedures: Your physician has recommended that you have an ablation. Catheter ablation is a medical procedure used to treat some cardiac arrhythmias (irregular heartbeats). During catheter ablation, a long, thin, flexible tube is put into a blood vessel in your groin (upper thigh), or neck. This tube is called an ablation catheter. It is then guided to your heart through the blood vessel. Radio frequency waves destroy small areas of heart tissue where abnormal heartbeats may cause an arrhythmia to start. Please see the instruction sheet given to you today.  Instructions for your ablation: 1. [facility]  at 9:30 a.m. on 01/05/19. 2. Do not eat or drink after midnight the night prior to the procedure.  4. Do not take any medications the morning of the procedure. 5. Plan for an overnight stay in the hospital. 6. You will need someone to drive you home at discharge.   Follow-Up: Your physician recommends that you schedule a follow-up appointment in: 4 weeks, after your procedure on 01/05/19, with Dr. Elberta Fortisamnitz.  *Please note that any paperwork needing to be filled out by the provider will need to be addressed at the front desk prior to seeing the provider. Please note that any FMLA, disability or other documents regarding health condition is subject to a $25.00 charge that must be received prior to completion of paperwork in the form of a money order or check.  Thank you for choosing [facility]!!   Dory HornSherri  Lynsey Ange, RN [phone]  Any Other Special Instructions Will Be Listed Below (If Applicable).    Cardiac Ablation Cardiac ablation is a procedure to disable (ablate) a small amount of heart tissue in very specific places. The heart has many electrical connections. Sometimes these connections are abnormal and can cause the heart to beat very fast or irregularly. Ablating some of the problem areas can improve the heart rhythm or return it to normal. Ablation may be done for people who:  Have Wolff-Parkinson-White syndrome.  Have fast heart rhythms (tachycardia).  Have taken medicines for an abnormal heart rhythm (arrhythmia) that were not effective or caused side effects.  Have a high-risk heartbeat that may be life-threatening. During the procedure, a small incision is made in the neck or the groin, and a long, thin, flexible tube (catheter) is inserted into the incision and moved to the heart. Small devices (electrodes) on the tip of the catheter will send out electrical currents. A type of X-ray (fluoroscopy) will be used to help guide the catheter and to provide images of the heart. Tell a health care provider about:  Any allergies you have.  All medicines you are taking, including vitamins, herbs, eye drops, creams, and over-the-counter medicines.  Any problems you or family members have had with anesthetic medicines.  Any blood disorders you have.  Any surgeries you have had.  Any medical conditions you have, such as kidney failure.  Whether you are pregnant or may be pregnant. What are the risks? Generally, this is a safe procedure. However, problems may occur, including:  Infection.  Bruising and bleeding at the catheter insertion site.  Bleeding into the chest, especially into the sac that surrounds the heart. This is a serious complication.  Stroke or blood clots.  Damage to other structures or organs.  Allergic reaction to medicines or dyes.  Need for a  permanent pacemaker if the normal electrical system is damaged. A pacemaker is a small computer that sends electrical signals to the heart and helps your heart beat normally.  The procedure not being fully effective. This may not be recognized until months later. Repeat ablation procedures are sometimes required. What happens before the procedure?  Follow instructions from your health care provider about eating or drinking restrictions.  Ask your health care provider about: ? Changing or stopping your regular medicines. This is especially important if you are taking diabetes medicines or blood thinners. ? Taking medicines such as aspirin and ibuprofen. These medicines can thin your blood. Do not take these medicines before your procedure if your health care provider instructs you not to.  Plan to have someone take you home from the hospital or clinic.  If you will be going home right after the procedure, plan to have someone with you for 24 hours. What happens during the procedure?  To lower your risk of infection: ? Your health care team will wash or sanitize their hands. ? Your skin will be washed with soap. ? Hair may be removed from the incision area.  An IV tube will be inserted into one of your veins.  You will be given a medicine to help you relax (sedative).  The skin on your neck or groin will be numbed.  An incision will be made in your neck or your groin.  A needle will be inserted through the incision and into a large vein in your neck or groin.  A catheter will be inserted into the needle and moved to your heart.  Dye may be injected through the catheter to help your surgeon see the area of the heart that needs treatment.  Electrical currents will be sent from the catheter to ablate heart tissue in desired areas. There are three types of energy that may be used to ablate heart tissue: ? Heat (radiofrequency energy). ? Laser energy. ? Extreme cold  (cryoablation).  When the necessary tissue has been ablated, the catheter will be removed.  Pressure will be held on the catheter insertion area to prevent excessive bleeding.  A bandage (dressing) will be placed over the catheter insertion area. The procedure may vary among health care providers and hospitals. What happens after the procedure?  Your blood pressure, heart rate, breathing rate, and blood oxygen level will be monitored until the medicines you were given have worn off.  Your catheter insertion area will be monitored for bleeding. You will need to lie still for a few hours to ensure that you do not bleed from the catheter insertion area.  Do not drive for 24 hours or as long as directed by your health care provider. Summary  Cardiac ablation is a procedure to disable (ablate) a small amount of heart tissue in very specific places. Ablating some of the problem areas can improve the heart rhythm or return it to normal.  During the procedure, electrical currents will be sent from the catheter to ablate heart tissue in desired areas. This information is not intended to replace advice given to you by your health care provider. Make sure you discuss any questions you have with your health  care provider. Document Released: 03/08/2009 Document Revised: 09/08/2016 Document Reviewed: 09/08/2016 Elsevier Interactive Patient Education  2019 [employer]

## 2018-12-08 NOTE — Addendum Note (Signed)
Addended by: Baird Lyons on: 12/08/2018 10:56 AM   Modules accepted: Orders

## 2018-12-08 NOTE — Addendum Note (Signed)
Addended by: Tonita PhoenixBOWDEN, Brock Larmon K on: 12/08/2018 11:06 AM   Modules accepted: Orders

## 2018-12-08 NOTE — Progress Notes (Signed)
Electrophysiology Office Note   Date:  12/08/2018   ID:  Andrea Mendoza, DOB 03/06/1956, MRN [id number]  PCP:  Andrea RuaMeyers, Stephen, MD  Cardiologist: Andrea Mendoza Primary Electrophysiologist:  Andrea Mendoza Evelyne Makepeace, MD    No chief complaint on file.    History of Present Illness: Andrea Mendoza is a 63 y.o. female who presents today for electrophysiology evaluation.   Being referred by Dr. Antoine Mendoza for SVT.  She has had multiple episodes of narrow complex tachycardia with heart rates in the 150s.  It appears to be a short RP tachycardia.  On November 15 she had a heart rate of 150 that did not break with vagal maneuvers.  Today, denies symptoms of palpitations, chest pain, shortness of breath, orthopnea, PND, lower extremity edema, claudication, dizziness, presyncope, syncope, bleeding, or neurologic sequela. The patient is tolerating medications without difficulties.  He feels well today.  That being said she has certainly had complications over the last year.  She says that every 3 to 4 months she has episodes.  The episodes last a few hours at a time.  They do not terminate with vagal maneuvers.  Over the past few months, her episodes have occurred more frequently.  She has tried an extra dose of metoprolol with mixed results.  Past Medical History:  Diagnosis Date  . Abnormal vaginal Pap smear   . History of fibrocystic disease of breast   . Hx of migraines   . Kidney stones   . Osteopenia   . Post menopausal problems   . Transient hypertension    Past Surgical History:  Procedure Laterality Date  . BREAST BIOPSY Right   . VAGINAL HYSTERECTOMY       Current Outpatient Medications  Medication Sig Dispense Refill  . atorvastatin (LIPITOR) 40 MG tablet TAKE 1 TABLET BY MOUTH ONCE DAILY 90 tablet 3  . Cholecalciferol (VITAMIN D3 PO) Take by mouth.    . Estradiol-Estriol-Progesterone (BIEST/PROGESTERONE) CREA Place onto the skin. Pt states that they are separate compound prescriptions.      . Hormone Cream Base CREA Use as directed, PATIENT DO NOT KNOW DOSAGE    . MAGNESIUM PO Take 1 tablet by mouth daily.    . metoprolol tartrate (LOPRESSOR) 25 MG tablet Take 0.5 tablets (12.5 mg total) by mouth at bedtime. 45 tablet 3  . Probiotic Product (PROBIOTIC ADVANCED PO) Take 1 capsule by mouth daily.    . progesterone (PROMETRIUM) 100 MG capsule Take 100 mg by mouth daily. Combined with DHEA    . Thiamine HCl (VITAMIN B1 PO) Take 1 tablet by mouth daily.     No current facility-administered medications for this visit.     Allergies:   Patient has no known allergies.   Social History:  The patient  reports that she has never smoked. She has never used smokeless tobacco. She reports current alcohol use. She reports that she does not use drugs.   Family History:  The patient's family history includes Breast cancer in an other family member; CAD in some other family members; CVA in some other family members; Colon polyps in an other family member; Dementia in an other family member; Heart attack in her father and mother; Osteoporosis in an other family member; Stroke in an other family member.    ROS:  Please see the history of present illness.   Otherwise, review of systems is positive for weight change.   All other systems are reviewed and negative.   PHYSICAL EXAM: VS:  BP 124/62   Pulse 60   Ht 5\' 6"  (1.676 m)   Wt 162 lb (73.5 kg)   BMI 26.15 kg/m  , BMI Body mass index is 26.15 kg/m. GEN: Well nourished, well developed, in no acute distress  HEENT: normal  Neck: no JVD, carotid bruits, or masses Cardiac: RRR; no murmurs, rubs, or gallops,no edema  Respiratory:  clear to auscultation bilaterally, normal work of breathing GI: soft, nontender, nondistended, + BS MS: no deformity or atrophy  Skin: warm and dry Neuro:  Strength and sensation are intact Psych: euthymic mood, full affect  EKG:  EKG is ordered today. Personal review of the ekg ordered shows SR, rate 60    Recent Labs: No results found for requested labs within last 8760 hours.    Lipid Panel  No results found for: CHOL, TRIG, HDL, CHOLHDL, VLDL, LDLCALC, LDLDIRECT   Wt Readings from Last 3 Encounters:  12/08/18 162 lb (73.5 kg)  10/20/18 164 lb (74.4 kg)  04/23/18 160 lb (72.6 kg)   ASSESSMENT AND PLAN:  1.  SVT: Appears to be due to AVNRT based off on EKG from 05/11/2017.  She is continued to have prolonged episodes and at this point wishes to have ablation performed.  Risks and benefits were discussed include bleeding, tamponade, heart block, stroke, among others.  She understands these risks and is agreed to the procedure.   Current medicines are reviewed at length with the patient today.   The patient has concerns regarding her medicines.  The following changes were made today: None  Labs/ tests ordered today include:  Orders Placed This Encounter  Procedures  . EKG 12-Lead   Case discussed with primary cardiology  Disposition:   FU with Andrea Mendoza 2 months  Signed, Phyillis Dascoli Jorja Loa, MD  12/08/2018 10:48 AM     [facility] [address] [place] [address] [phone] (office) [phone] (fax)

## 2018-12-09 ENCOUNTER — Ambulatory Visit
Admission: RE | Admit: 2018-12-09 | Discharge: 2018-12-09 | Disposition: A | Discharge status: Home / Self Care | Payer: Medicare Other | Source: Ambulatory Visit | Attending: Family Medicine | Admitting: Family Medicine

## 2018-12-09 DIAGNOSIS — Z1231 Encounter for screening mammogram for malignant neoplasm of breast: Secondary | ICD-10-CM

## 2018-12-09 LAB — BASIC METABOLIC PANEL
BUN/Creatinine Ratio: 17 (ref 12–28)
BUN: 17 mg/dL (ref 8–27)
CO2: 21 mmol/L (ref 20–29)
CREATININE: 0.99 mg/dL (ref 0.57–1.00)
Calcium: 9.3 mg/dL (ref 8.7–10.3)
Chloride: 103 mmol/L (ref 96–106)
GFR calc Af Amer: 70 mL/min/{1.73_m2} (ref >59)
GFR, EST NON AFRICAN AMERICAN: 60 mL/min/{1.73_m2} (ref >59)
Glucose: 97 mg/dL (ref 65–99)
Potassium: 4.8 mmol/L (ref 3.5–5.2)
Sodium: 139 mmol/L (ref 134–144)

## 2018-12-09 LAB — CBC
HEMOGLOBIN: 13.6 g/dL (ref 11.1–15.9)
Hematocrit: 40.9 % (ref 34.0–46.6)
MCH: 31 pg (ref 26.6–33.0)
MCHC: 33.3 g/dL (ref 31.5–35.7)
MCV: 93 fL (ref 79–97)
Platelets: 292 10*3/uL (ref 150–450)
RBC: 4.39 x10E6/uL (ref 3.77–5.28)
RDW: 12.7 % (ref 11.7–15.4)
WBC: 6.2 10*3/uL (ref 3.4–10.8)

## 2019-01-05 ENCOUNTER — Encounter (HOSPITAL_COMMUNITY)
Admission: RE | Disposition: A | Discharge status: Home / Self Care | Payer: Self-pay | Source: Home / Self Care | Attending: Cardiology

## 2019-01-05 ENCOUNTER — Ambulatory Visit (HOSPITAL_COMMUNITY)
Admission: RE | Admit: 2019-01-05 | Discharge: 2019-01-05 | Disposition: A | Discharge status: Home / Self Care | Payer: Medicare Other | Attending: Cardiology | Admitting: Cardiology

## 2019-01-05 ENCOUNTER — Other Ambulatory Visit: Payer: Self-pay

## 2019-01-05 DIAGNOSIS — I471 Supraventricular tachycardia: Secondary | ICD-10-CM | POA: Diagnosis present

## 2019-01-05 DIAGNOSIS — I10 Essential (primary) hypertension: Secondary | ICD-10-CM | POA: Diagnosis not present

## 2019-01-05 DIAGNOSIS — M8580 Other specified disorders of bone density and structure, unspecified site: Secondary | ICD-10-CM | POA: Insufficient documentation

## 2019-01-05 DIAGNOSIS — Z823 Family history of stroke: Secondary | ICD-10-CM | POA: Insufficient documentation

## 2019-01-05 DIAGNOSIS — Z79899 Other long term (current) drug therapy: Secondary | ICD-10-CM | POA: Insufficient documentation

## 2019-01-05 DIAGNOSIS — Z79890 Hormone replacement therapy (postmenopausal): Secondary | ICD-10-CM | POA: Insufficient documentation

## 2019-01-05 DIAGNOSIS — Z90710 Acquired absence of both cervix and uterus: Secondary | ICD-10-CM | POA: Insufficient documentation

## 2019-01-05 DIAGNOSIS — Z8249 Family history of ischemic heart disease and other diseases of the circulatory system: Secondary | ICD-10-CM | POA: Diagnosis not present

## 2019-01-05 DIAGNOSIS — Z8262 Family history of osteoporosis: Secondary | ICD-10-CM | POA: Diagnosis not present

## 2019-01-05 HISTORY — PX: SVT ABLATION: EP1225

## 2019-01-05 SURGERY — SVT ABLATION

## 2019-01-05 MED ORDER — MIDAZOLAM HCL 5 MG/5ML IJ SOLN
INTRAMUSCULAR | Status: DC | PRN
  Administered 2019-01-05: 1 mg via INTRAVENOUS
  Administered 2019-01-05: 2 mg via INTRAVENOUS
  Administered 2019-01-05 (×2): 1 mg via INTRAVENOUS

## 2019-01-05 MED ORDER — BUPIVACAINE HCL (PF) 0.25 % IJ SOLN
INTRAMUSCULAR | Status: AC
  Filled 2019-01-05: qty 60

## 2019-01-05 MED ORDER — SODIUM CHLORIDE 0.9% FLUSH: 3.0000 mL | INTRAVENOUS | Status: DC | PRN

## 2019-01-05 MED ORDER — ONDANSETRON HCL 4 MG/2ML IJ SOLN: 4.0000 mg | Freq: Four times a day (QID) | INTRAMUSCULAR | Status: DC | PRN

## 2019-01-05 MED ORDER — FENTANYL CITRATE (PF) 100 MCG/2ML IJ SOLN
INTRAMUSCULAR | Status: AC
  Filled 2019-01-05: qty 2

## 2019-01-05 MED ORDER — SODIUM CHLORIDE 0.9 % IV SOLN: 250.0000 mL | INTRAVENOUS | Status: DC | PRN

## 2019-01-05 MED ORDER — SODIUM CHLORIDE 0.9 % IV SOLN
INTRAVENOUS | Status: DC
  Administered 2019-01-05: 10:00:00 via INTRAVENOUS

## 2019-01-05 MED ORDER — SODIUM CHLORIDE 0.9 % IV SOLN
INTRAVENOUS | Status: DC | PRN
  Administered 2019-01-05: 2 ug/min via INTRAVENOUS

## 2019-01-05 MED ORDER — BUPIVACAINE HCL (PF) 0.25 % IJ SOLN
INTRAMUSCULAR | Status: DC | PRN
  Administered 2019-01-05: 45 mL

## 2019-01-05 MED ORDER — HEPARIN (PORCINE) IN NACL 1000-0.9 UT/500ML-% IV SOLN
INTRAVENOUS | Status: DC | PRN
  Administered 2019-01-05: 500 mL

## 2019-01-05 MED ORDER — HEPARIN (PORCINE) IN NACL 1000-0.9 UT/500ML-% IV SOLN
INTRAVENOUS | Status: AC
  Filled 2019-01-05: qty 500

## 2019-01-05 MED ORDER — SODIUM CHLORIDE 0.9% FLUSH: 3.0000 mL | Freq: Two times a day (BID) | INTRAVENOUS | Status: DC

## 2019-01-05 MED ORDER — FENTANYL CITRATE (PF) 100 MCG/2ML IJ SOLN
INTRAMUSCULAR | Status: DC | PRN
  Administered 2019-01-05 (×3): 25 ug via INTRAVENOUS

## 2019-01-05 MED ORDER — SODIUM CHLORIDE 0.9 % IV SOLN: INTRAVENOUS | Status: DC | PRN

## 2019-01-05 MED ORDER — ACETAMINOPHEN 325 MG PO TABS
650.0000 mg | ORAL_TABLET | ORAL | Status: DC | PRN
  Filled 2019-01-05: qty 2

## 2019-01-05 MED ORDER — ISOPROTERENOL HCL 0.2 MG/ML IJ SOLN
INTRAMUSCULAR | Status: AC
  Filled 2019-01-05: qty 5

## 2019-01-05 MED ORDER — MIDAZOLAM HCL 5 MG/5ML IJ SOLN
INTRAMUSCULAR | Status: AC
  Filled 2019-01-05: qty 5

## 2019-01-05 SURGICAL SUPPLY — 11 items
BAG SNAP BAND KOVER 36X36 (MISCELLANEOUS) ×1 IMPLANT
CATH EZ STEER NAV 4MM D-F CUR (ABLATOR) ×1 IMPLANT
CATH JOSEPH QUAD ALLRED 6F REP (CATHETERS) ×2 IMPLANT
CATH WEBSTER BI DIR CS D-F CRV (CATHETERS) ×1 IMPLANT
PACK EP LATEX FREE (CUSTOM PROCEDURE TRAY) ×2
PACK EP LF (CUSTOM PROCEDURE TRAY) ×1 IMPLANT
PAD PRO RADIOLUCENT 2001M-C (PAD) ×2 IMPLANT
PATCH CARTO3 (PAD) ×1 IMPLANT
SHEATH PINNACLE 6F 10CM (SHEATH) ×2 IMPLANT
SHEATH PINNACLE 7F 10CM (SHEATH) ×1 IMPLANT
SHEATH PINNACLE 8F 10CM (SHEATH) ×1 IMPLANT

## 2019-01-05 NOTE — Discharge Instructions (Signed)
Post procedure care instructions No driving for 4 days. No lifting over 5 lbs for 1 week. No vigorous or sexual activity for 1 week. You may return to work on 01/12/2019. Keep procedure site clean & dry. If you notice increased pain, swelling, bleeding or pus, call/return!  You may shower, but no soaking baths/hot tubs/pools for 1 week.    Venous Site Care This sheet gives you information about how to care for yourself after your procedure. Your health care provider may also give you more specific instructions. If you have problems or questions, contact your health care provider. What can I expect after the procedure? After the procedure, it is common to have:  Bruising that usually fades within 1-2 weeks.  Tenderness at the site. Follow these instructions at home: Wound care  Follow instructions from your health care provider about how to take care of your insertion site. Make sure you: ? Wash your hands with soap and water before you change your bandage (dressing). If soap and water are not available, use hand sanitizer. ? Change your dressing as told by your health care provider. ? Leave stitches (sutures), skin glue, or adhesive strips in place. These skin closures may need to stay in place for 2 weeks or longer. If adhesive strip edges start to loosen and curl up, you may trim the loose edges. Do not remove adhesive strips completely unless your health care provider tells you to do that.  Do not take baths, swim, or use a hot tub until your health care provider approves.  You may shower 24-48 hours after the procedure or as told by your health care provider. ? Gently wash the site with plain soap and water. ? Pat the area dry with a clean towel. ? Do not rub the site. This may cause bleeding.  Do not apply powder or lotion to the site. Keep the site clean and dry.  Check your femoral site every day for signs of infection. Check for: ? Redness, swelling, or pain. ? Fluid or  blood. ? Warmth. ? Pus or a bad smell. Activity  For the first 2-3 days after your procedure, or as long as directed: ? Avoid climbing stairs as much as possible. ? Do not squat.  Do not lift anything that is heavier than 10 lb (4.5 kg), or the limit that you are told, until your health care provider says that it is safe.  Rest as directed. ? Avoid sitting for a long time without moving. Get up to take short walks every 1-2 hours.  Do not drive for 24 hours if you were given a medicine to help you relax (sedative). General instructions  Take over-the-counter and prescription medicines only as told by your health care provider.  Keep all follow-up visits as told by your health care provider. This is important. Contact a health care provider if you have:  A fever or chills.  You have redness, swelling, or pain around your insertion site. Get help right away if:  The catheter insertion area swells very fast.  You pass out.  You suddenly start to sweat or your skin gets clammy.  The catheter insertion area is bleeding, and the bleeding does not stop when you hold steady pressure on the area.  The area near or just beyond the catheter insertion site becomes pale, cool, tingly, or numb. These symptoms may represent a serious problem that is an emergency. Do not wait to see if the symptoms will go away. Get  medical help right away. Call your local emergency services (911 in the [place] Do not drive yourself to the hospital. Summary  After the procedure, it is common to have bruising that usually fades within 1-2 weeks.  Check your femoral site every day for signs of infection.  Do not lift anything that is heavier than 10 lb (4.5 kg), or the limit that you are told, until your health care provider says that it is safe. This information is not intended to replace advice given to you by your health care provider. Make sure you discuss any questions you have with your health care  provider. Document Released: 06/23/2014 Document Revised: 11/02/2017 Document Reviewed: 11/02/2017 Elsevier Interactive Patient Education  2019 [employer].

## 2019-01-05 NOTE — H&P (Signed)
Andrea Mendoza has presented today for surgery, with the diagnosis of SVT.  The various methods of treatment have been discussed with the patient and family. After consideration of risks, benefits and other options for treatment, the patient has consented to  Procedure(s): Catheter ablation as a surgical intervention .  Risks include but not limited to bleeding, tamponade, heart block, stroke, damage to surrounding organs, among others. The patient's history has been reviewed, patient examined, no change in status, stable for surgery.  I have reviewed the patient's chart and labs.  Questions were answered to the patient's satisfaction.    Will [name], MD 01/05/2019 10:55 AM

## 2019-01-05 NOTE — Progress Notes (Signed)
Site area: Right groin a 6 and 8 french venous sheath was removed  Site Prior to Removal:  Level 0  Pressure Applied For 20 MINUTES    Bedrest Beginning at 1420p  Manual:   Yes.    Patient Status During Pull:  stable  Post Pull Groin Site:  Level 0  Post Pull Instructions Given:  Yes.    Post Pull Pulses Present:  Yes.    Dressing Applied:  Yes.    Comments:   VS remain stable

## 2019-01-05 NOTE — Progress Notes (Signed)
Site area: Left groin a 6 and 7 french venous sheath was removed Luna Kitchens RRT  Site Prior to Removal:  Level 0  Pressure Applied For 15 MINUTES    Bedrest Beginning at 1420p  Manual:   Yes.    Patient Status During Pull:  Stable  Post Pull Groin Site:  Level 0  Post Pull Instructions Given:  Yes.    Post Pull Pulses Present:  Yes.    Dressing Applied:  Yes.    Comments:  VS remain stable

## 2019-01-05 NOTE — Progress Notes (Signed)
No bleeding or swelling noted after ambulation 

## 2019-01-06 ENCOUNTER — Encounter (HOSPITAL_COMMUNITY): Payer: Self-pay | Admitting: Cardiology

## 2019-01-28 ENCOUNTER — Telehealth: Payer: Self-pay | Admitting: *Deleted

## 2019-01-28 NOTE — Telephone Encounter (Signed)
Called patient to let them know due to recent COVID19 CDC and Health Department Protocols, we are not seeing patients in the office on Tuesday in [place]. She is agreeable to and prefers to use WebEx at the current time due to COVID19.  WebEx information sent VIA MyChart.

## 2019-01-31 NOTE — Telephone Encounter (Signed)
Follow up   Patient is needing a callback to make that she has smart phone setup correctly for virtual visit. Please call.

## 2019-01-31 NOTE — Telephone Encounter (Signed)
Was told by scheduler patient needed instructions for WebEx Virtual visit tomorrow with Dr. [name]. Called patient back to go over instructions given VIA MyChart Message.  Patient states she has not checked MyChart yet for instructions and will call back in an hour to see what to do.

## 2019-02-01 ENCOUNTER — Telehealth (INDEPENDENT_AMBULATORY_CARE_PROVIDER_SITE_OTHER): Payer: Medicare Other | Admitting: Cardiology

## 2019-02-01 ENCOUNTER — Other Ambulatory Visit: Payer: Self-pay

## 2019-02-01 DIAGNOSIS — I471 Supraventricular tachycardia: Secondary | ICD-10-CM

## 2019-02-01 NOTE — Patient Instructions (Signed)
Medication Instructions:  Your physician has recommended you make the following change in your medication:  1. STOP Metoprolol tartrate (Lopressor)  * If you need a refill on your cardiac medications before your next appointment, please call your pharmacy.   Labwork: None ordered  Testing/Procedures: None ordered  Follow-Up: No follow up is needed at this time with Dr. [name].  He will see you on an as needed basis.   Thank you for choosing [facility]!!   Dory Horn, RN [phone]

## 2019-02-01 NOTE — Addendum Note (Signed)
Addended by: Baird Lyons on: 02/01/2019 10:54 AM   Modules accepted: Orders

## 2019-02-01 NOTE — Telephone Encounter (Signed)
Spoke with patient and verified she was ready for meeting today. She understands I will call her when time to start the meeting.

## 2019-02-01 NOTE — Progress Notes (Signed)
Electrophysiology TeleHealth Note   Due to national recommendations of social distancing due to COVID 19, an audio/video telehealth visit is felt to be most appropriate for this patient at this time.  See MyChart message from today for the patient's consent to telehealth for Andrea Mendoza.   Date:  02/01/2019   ID:  Andrea Mendoza, DOB [birth year], MRN [id number]  Location: patient's home  Provider location: [address] [place]  Evaluation Performed: Follow-up visit  PCP:  Orpah Melter, MD  Cardiologist:  Hochrein Electrophysiologist:  Dr Curt Bears  Chief Complaint:  SVT  History of Present Illness:    Andrea Mendoza is a 63 y.o. female who presents via audio/video conferencing for a telehealth visit today.  Since last being seen in our clinic, the patient reports doing very well.  Today, she denies symptoms of palpitations, chest pain, shortness of breath,  lower extremity edema, dizziness, presyncope, or syncope.  The patient is otherwise without complaint today.  The patient denies symptoms of fevers, chills, cough, or new SOB worrisome for COVID 19.  Today, denies symptoms of palpitations, chest pain, shortness of breath, orthopnea, PND, lower extremity edema, claudication, dizziness, presyncope, syncope, bleeding, or neurologic sequela. The patient is tolerating medications without difficulties.  She has a history of SVT.  She is status post ablation of AVNRT 01/05/2019.  Since her ablation she has done well.  She has had no chest pain or shortness of breath.  She is unaware of any further episodes of prolonged tachycardia.  Past Medical History:  Diagnosis Date  . Abnormal vaginal Pap smear   . History of fibrocystic disease of breast   . Hx of migraines   . Kidney stones   . Osteopenia   . Post menopausal problems   . Transient hypertension     Past Surgical History:  Procedure Laterality Date  . BREAST BIOPSY Right   . SVT ABLATION N/A 01/05/2019   Procedure: SVT ABLATION;  Surgeon: Constance Haw, MD;  Location: [facility];  Service: Cardiovascular;  Laterality: N/A;  . VAGINAL HYSTERECTOMY      Current Outpatient Medications  Medication Sig Dispense Refill  . Aspirin-Acetaminophen-Caffeine (GOODY HEADACHE PO) Take 1 packet by mouth daily as needed (headaches).    Marland Kitchen atorvastatin (LIPITOR) 40 MG tablet TAKE 1 TABLET BY MOUTH ONCE DAILY (Patient taking differently: Take 40 mg by mouth every evening. ) 90 tablet 3  . Cholecalciferol (VITAMIN D) 50 MCG (2000 UT) tablet Take 2,000 Units by mouth at bedtime.    . Estradiol-Estriol-Progesterone (BIEST/PROGESTERONE) CREA Place 1 application onto the skin every Monday, Tuesday, Wednesday, Thursday, and Friday. 0.2(0.16-0.04) mg/ml    . ibuprofen (ADVIL,MOTRIN) 200 MG tablet Take 200 mg by mouth daily as needed for moderate pain.    . Magnesium 100 MG CAPS Take 100 mg by mouth every evening.    . metoprolol tartrate (LOPRESSOR) 25 MG tablet Take 0.5 tablets (12.5 mg total) by mouth at bedtime. 45 tablet 3  . Probiotic Product (PROBIOTIC-10 PO) Take by mouth.    . Progesterone Micronized (PROGESTERONE PO) Take 1 capsule by mouth every Monday, Tuesday, Wednesday, Thursday, and Friday. 150 mg progesterone 5 mg dhea    . SUPER B COMPLEX/C PO Take 1 tablet by mouth every evening.    . TESTOSTERONE COMPOUNDING KIT TD Place 1 application onto the skin See admin instructions. 0.02%  0.18m /ml apply topically daily except on sundays     No current  facility-administered medications for this visit.     Allergies:   Patient has no known allergies.   Social History:  The patient  reports that she has never smoked. She has never used smokeless tobacco. She reports current alcohol use. She reports that she does not use drugs.   Family History:  The patient's  family history includes Breast cancer in an other family member; CAD in some other family members; CVA in some other family members; Colon  polyps in an other family member; Dementia in an other family member; Heart attack in her father and mother; Osteoporosis in an other family member; Stroke in an other family member.   ROS:  Please see the history of present illness.   All other systems are personally reviewed and negative.    Exam:    Vital Signs:  BP 131/81   Pulse 60   Wt 159 lb (72.1 kg)   BMI 25.66 kg/m   Well appearing, alert and conversant, regular work of breathing,  good skin color Eyes- anicteric, neuro- grossly intact, skin- no apparent rash or lesions or cyanosis, mouth- oral mucosa is pink   Labs/Other Tests and Data Reviewed:    Recent Labs: 12/08/2018: BUN 17; Creatinine, Ser 0.99; Hemoglobin 13.6; Platelets 292; Potassium 4.8; Sodium 139   Wt Readings from Last 3 Encounters:  02/01/19 159 lb (72.1 kg)  01/05/19 160 lb (72.6 kg)  12/08/18 162 lb (73.5 kg)     Other studies personally reviewed: Additional studies/ records that were reviewed today include: ECG 01/05/2019 personally reviewed Review of the above records today demonstrates:  Sinus rhythm, rate 60    ASSESSMENT & PLAN:    1.  AVNRT: Status post ablation 01/05/2019.  Fortunately, she has had no further episodes of SVT.  She is felt well without major complaint.  Since her SVT has resolved post ablation, we Kendrah Lovern plan to stop her metoprolol evening dose.  No other changes.   COVID 19 screen The patient denies symptoms of COVID 19 at this time.  The importance of social distancing was discussed today.  Follow-up: PRN.  She Mulki Roesler get an ECG at her next cardiology appointment.   Current medicines are reviewed at length with the patient today.   The patient does not have concerns regarding her medicines.  The following changes were made today: Stop metoprolol  Labs/ tests ordered today include:  No orders of the defined types were placed in this encounter.  Case discussed with primary cardiology  Patient Risk:  after full review of  this patients clinical status, I feel that they are at moderate risk at this time.  Today, I have spent 15 minutes with the patient with telehealth technology discussing SVT.    Signed, Anthonymichael Munday Meredith Leeds, MD  02/01/2019 10:08 AM     [address] [place] [address] [phone] (office) [phone] (fax)

## 2019-02-22 ENCOUNTER — Ambulatory Visit: Payer: BLUE CROSS/BLUE SHIELD | Admitting: Internal Medicine

## 2019-04-19 ENCOUNTER — Telehealth: Payer: Self-pay | Admitting: *Deleted

## 2019-04-19 NOTE — Telephone Encounter (Signed)
-----   Message from Caprice Kluver sent at 04/19/2019 11:39 AM EDT ----- Regarding: Needed Covid testing Shafter Jupin,  this patient was scheduled by Deedie in June of last year for a GXT on April 26, 2019 at [place].  I will complete this GXT if you can arrange for her to undergo Covid testing by this Thursday June 18th.  Please let me know if you are able to schedule the Covid testing with the patient, if not Jari Sportsman will need to cancel the test and reschedule for a later date with the Covid testing being performed prior to.  Thanks, Shirlean Mylar

## 2019-04-19 NOTE — Telephone Encounter (Signed)
Leave message for pt to call back to schedule covid test

## 2019-04-22 NOTE — Telephone Encounter (Signed)
  Patient is returning a call regarding covid screening

## 2019-04-26 ENCOUNTER — Ambulatory Visit (HOSPITAL_COMMUNITY)
Admission: RE | Admit: 2019-04-26 | Payer: Medicare Other | Source: Ambulatory Visit | Attending: Cardiology | Admitting: Cardiology

## 2019-04-29 NOTE — Telephone Encounter (Signed)
Advised patient trying to get GXT scheduled, unable to scheduled COVID text until then. Will follow up next week Have sent messages to schedulers to arrange GXT

## 2019-05-12 ENCOUNTER — Encounter: Payer: Self-pay | Admitting: *Deleted

## 2019-05-12 NOTE — Telephone Encounter (Signed)
This encounter was created in error - please disregard.

## 2019-05-12 NOTE — Telephone Encounter (Signed)
Given the fact that this was an elective screening POET ([employer] Exercise Treadmill) and there is a pandemic we could wait until things have calmed down.  Put down for six months POET ([employer] Exercise Treadmill) if she is willing.

## 2019-05-12 NOTE — Telephone Encounter (Addendum)
Had sent another message to schedulers to follow up on GXT.  Called patient to follow up.This GXT was ordered last year to be done in 1 year prior to Dr Hochrein's visit. Since then the patient has had an ablation. Did explain that the GXT was to be done secondary to her elevated calcium score. Patient stated under the circumstance she would like message still be sent to Dr Percival Spanish to make sure this is still his plan.  Will forward to him for review

## 2019-05-19 ENCOUNTER — Telehealth: Payer: Medicare Other | Admitting: Cardiology

## 2019-05-26 ENCOUNTER — Ambulatory Visit (INDEPENDENT_AMBULATORY_CARE_PROVIDER_SITE_OTHER): Payer: Medicare Other | Admitting: Internal Medicine

## 2019-05-26 ENCOUNTER — Other Ambulatory Visit: Payer: Self-pay

## 2019-05-26 ENCOUNTER — Encounter: Payer: Self-pay | Admitting: Internal Medicine

## 2019-05-26 VITALS — BP 122/74 | HR 61 | Temp 97.5°F | Ht 66.0 in | Wt 158.0 lb

## 2019-05-26 DIAGNOSIS — N951 Menopausal and female climacteric states: Secondary | ICD-10-CM | POA: Diagnosis not present

## 2019-05-26 DIAGNOSIS — I471 Supraventricular tachycardia: Secondary | ICD-10-CM | POA: Diagnosis not present

## 2019-05-29 ENCOUNTER — Encounter: Payer: Self-pay | Admitting: Internal Medicine

## 2019-05-29 NOTE — Progress Notes (Signed)
Virtual Visit via Video   This visit type was conducted due to national recommendations for restrictions regarding the COVID-19 Pandemic (e.g. social distancing) in an effort to limit this patient's exposure and mitigate transmission in our community.  Due to her co-morbid illnesses, this patient is at least at moderate risk for complications without adequate follow up.  This format is felt to be most appropriate for this patient at this time.  All issues noted in this document were discussed and addressed.  A limited physical exam was performed with this format.    This visit type was conducted due to national recommendations for restrictions regarding the COVID-19 Pandemic (e.g. social distancing) in an effort to limit this patient's exposure and mitigate transmission in our community.  Patients identity confirmed using two different identifiers.  This format is felt to be most appropriate for this patient at this time.  All issues noted in this document were discussed and addressed.  No physical exam was performed (except for noted visual exam findings with Video Visits).    Date:  05/29/2019   ID:  Andrea Mendoza, DOB 07-16-1956, MRN [id number]  Patient Location:  Home  Provider location:   Office    Chief Complaint:  BHRT f/u  History of Present Illness:    Andrea Mendoza is a 63 y.o. female who presents via video conferencing for a telehealth visit today.    The patient does not have symptoms concerning for COVID-19 infection (fever, chills, cough, or new shortness of breath).   She presents today for virtual visit. She prefers this method of contact due to COVID-19 pandemic. She is here today for f/u BHRT. She reports she is doing fairly well with her current BHRT regimen. She reports she recently started to experience hot flashes. She is unable to state what triggers her sx. She reports compliance with her medication regimen.     Past Medical History:  Diagnosis Date  . Abnormal  vaginal Pap smear   . History of fibrocystic disease of breast   . Hx of migraines   . Kidney stones   . Osteopenia   . Post menopausal problems   . Transient hypertension    Past Surgical History:  Procedure Laterality Date  . BREAST BIOPSY Right   . SVT ABLATION N/A 01/05/2019   Procedure: SVT ABLATION;  Surgeon: Constance Haw, MD;  Location: [facility];  Service: Cardiovascular;  Laterality: N/A;  . VAGINAL HYSTERECTOMY       No outpatient medications have been marked as taking for the 05/26/19 encounter (Office Visit) with Glendale Chard, MD.     Allergies:   Patient has no known allergies.   Social History   Tobacco Use  . Smoking status: Never Smoker  . Smokeless tobacco: Never Used  Substance Use Topics  . Alcohol use: Yes    Alcohol/week: 0.0 standard drinks  . Drug use: No     Family Hx: The patient's family history includes Breast cancer in an other family member; CAD in some other family members; CVA in some other family members; Colon polyps in an other family member; Dementia in an other family member; Heart attack in her father and mother; Osteoporosis in an other family member; Stroke in an other family member.  ROS:   Please see the history of present illness.    Review of Systems  Constitutional: Negative.   Respiratory: Negative.   Cardiovascular: Negative.        She  reports she recently had ablation. She has not had any issues since this procedure.   Gastrointestinal: Negative.   Neurological: Negative.   Psychiatric/Behavioral: Negative.     All other systems reviewed and are negative.   Labs/Other Tests and Data Reviewed:    Recent Labs: 12/08/2018: BUN 17; Creatinine, Ser 0.99; Hemoglobin 13.6; Platelets 292; Potassium 4.8; Sodium 139   Recent Lipid Panel No results found for: CHOL, TRIG, HDL, CHOLHDL, LDLCALC, LDLDIRECT  Wt Readings from Last 3 Encounters:  05/26/19 158 lb (71.7 kg)  02/01/19 159 lb (72.1 kg)  01/05/19 160  lb (72.6 kg)     Exam:    Vital Signs:  BP 122/74 (BP Location: Left Arm, Patient Position: Sitting, Cuff Size: Normal) Comment: pt provided  Pulse 61 Comment: pt provided  Temp (!) 97.5 F (36.4 C) (Oral) Comment: pt provided  Ht 5\' 6"  (1.676 m)   Wt 158 lb (71.7 kg) Comment: pt provided  BMI 25.50 kg/m     Physical Exam  Constitutional: She is oriented to person, place, and time and well-developed, well-nourished, and in no distress.  HENT:  Head: Normocephalic and atraumatic.  Pulmonary/Chest: Effort normal.  Neurological: She is alert and oriented to person, place, and time.  Psychiatric: Affect normal.  Nursing note and vitals reviewed.   ASSESSMENT & PLAN:     1. Female climacteric state  Chronic. She was advised to decrease Biest to 3/4cc daily except Sundays. She was encouraged to touch base with me in two weeks to let me know how she is doing. She will f/u in 4 months for re-evaluation. She agrees to come in next week for bloodwork. I will check CBC, CMP and testosterone levels.   2. SVT (supraventricular tachycardia) (HCC)  Chronic. S/P ablation. As per Cardiology.    COVID-19 Education: The signs and symptoms of COVID-19 were discussed with the patient and how to seek care for testing (follow up with PCP or arrange E-visit).  The importance of social distancing was discussed today.  Patient Risk:   After full review of this patients clinical status, I feel that they are at least moderate risk at this time.  Time:   Today, I have spent 21 minutes/ seconds with the patient with telehealth technology discussing above diagnoses.     Medication Adjustments/Labs and Tests Ordered: Current medicines are reviewed at length with the patient today.  Concerns regarding medicines are outlined above.   Tests Ordered: No orders of the defined types were placed in this encounter.   Medication Changes: No orders of the defined types were placed in this encounter.    Disposition:  Follow up in 4 month(s)  Signed, Gwynneth Alimentobyn N Sequoia Mincey, MD

## 2019-05-31 ENCOUNTER — Other Ambulatory Visit: Payer: Self-pay

## 2019-05-31 ENCOUNTER — Other Ambulatory Visit: Payer: Medicare Other

## 2019-05-31 DIAGNOSIS — Z79899 Other long term (current) drug therapy: Secondary | ICD-10-CM

## 2019-06-01 LAB — CMP14+EGFR
ALT: 23 IU/L (ref 0–32)
AST: 22 IU/L (ref 0–40)
Albumin/Globulin Ratio: 1.8 (ref 1.2–2.2)
Albumin: 4.2 g/dL (ref 3.8–4.8)
Alkaline Phosphatase: 71 IU/L (ref 39–117)
BUN/Creatinine Ratio: 17 (ref 12–28)
BUN: 14 mg/dL (ref 8–27)
Bilirubin Total: 0.5 mg/dL (ref 0.0–1.2)
CO2: 21 mmol/L (ref 20–29)
Calcium: 9.5 mg/dL (ref 8.7–10.3)
Chloride: 104 mmol/L (ref 96–106)
Creatinine, Ser: 0.84 mg/dL (ref 0.57–1.00)
GFR calc Af Amer: 84 mL/min/{1.73_m2} (ref >59)
GFR calc non Af Amer: 73 mL/min/{1.73_m2} (ref >59)
Globulin, Total: 2.4 g/dL (ref 1.5–4.5)
Glucose: 79 mg/dL (ref 65–99)
Potassium: 3.8 mmol/L (ref 3.5–5.2)
Sodium: 140 mmol/L (ref 134–144)
Total Protein: 6.6 g/dL (ref 6.0–8.5)

## 2019-06-01 LAB — CBC WITH DIFFERENTIAL/PLATELET
Basophils Absolute: 0 10*3/uL (ref 0.0–0.2)
Basos: 0 %
EOS (ABSOLUTE): 0.1 10*3/uL (ref 0.0–0.4)
Eos: 2 %
Hematocrit: 38.9 % (ref 34.0–46.6)
Hemoglobin: 13.3 g/dL (ref 11.1–15.9)
Immature Grans (Abs): 0 10*3/uL (ref 0.0–0.1)
Immature Granulocytes: 0 %
Lymphocytes Absolute: 2.6 10*3/uL (ref 0.7–3.1)
Lymphs: 38 %
MCH: 30.9 pg (ref 26.6–33.0)
MCHC: 34.2 g/dL (ref 31.5–35.7)
MCV: 91 fL (ref 79–97)
Monocytes Absolute: 0.5 10*3/uL (ref 0.1–0.9)
Monocytes: 7 %
Neutrophils Absolute: 3.6 10*3/uL (ref 1.4–7.0)
Neutrophils: 53 %
Platelets: 237 10*3/uL (ref 150–450)
RBC: 4.3 x10E6/uL (ref 3.77–5.28)
RDW: 12.7 % (ref 11.7–15.4)
WBC: 6.9 10*3/uL (ref 3.4–10.8)

## 2019-06-01 LAB — TESTOSTERONE: Testosterone: 13 ng/dL (ref 3–41)

## 2019-06-02 NOTE — Telephone Encounter (Signed)
Advised patient, verbalized understanding  

## 2019-06-27 ENCOUNTER — Other Ambulatory Visit: Payer: Self-pay | Admitting: Cardiology

## 2019-07-16 ENCOUNTER — Other Ambulatory Visit: Payer: Self-pay | Admitting: Cardiology

## 2019-08-08 ENCOUNTER — Telehealth: Payer: Self-pay | Admitting: Cardiology

## 2019-08-08 NOTE — Telephone Encounter (Signed)
Left message for patient to call and schedule GXT ordered by Dr. Percival Spanish for December 2020 per staff message from Alvina Filbert 08/04/19

## 2019-08-10 NOTE — Telephone Encounter (Signed)
Left message for patient to call and schedule GXT in December

## 2019-08-31 ENCOUNTER — Other Ambulatory Visit: Payer: Self-pay | Admitting: Internal Medicine

## 2019-09-07 NOTE — Progress Notes (Signed)
Cardiology Office Note   Date:  09/08/2019   ID:  Breeanna, Galgano 13-Feb-1956, MRN [id number]  PCP:  Orpah Melter, MD  Cardiologist:   Minus Breeding, MD   Chief Complaint  Patient presents with  . Hypertension      History of Present Illness: Andrea Mendoza is a 63 y.o. female who presents for evaluation of SVT and coronary calcium.  She has had some hypertensive episodes but this is mostly with stress.  She does take 12 and half milligrams of metoprolol as needed if the blood pressure is above 135.  She not really noticing any palpitations.  She denies any chest pressure, neck or arm discomfort.  She has no shortness of breath, PND or orthopnea.  She is had no further episodes of SVT since her ablation.  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   Past Medical History:  Diagnosis Date  . Abnormal vaginal Pap smear   . History of fibrocystic disease of breast   . Hx of migraines   . Kidney stones   . Osteopenia   . Post menopausal problems   . Transient hypertension     Past Surgical History:  Procedure Laterality Date  . BREAST BIOPSY Right   . SVT ABLATION N/A 01/05/2019   Procedure: SVT ABLATION;  Surgeon: Constance Haw, MD;  Location: [facility];  Service: Cardiovascular;  Laterality: N/A;  . VAGINAL HYSTERECTOMY       Current Outpatient Medications  Medication Sig Dispense Refill  . atorvastatin (LIPITOR) 40 MG tablet TAKE 1 TABLET BY MOUTH ONCE DAILY (Patient taking differently: Take 40 mg by mouth every evening. ) 90 tablet 3  . Cholecalciferol (VITAMIN D) 50 MCG (2000 UT) tablet Take 2,000 Units by mouth at bedtime.    . Estradiol-Estriol-Progesterone (BIEST/PROGESTERONE) CREA Place 1 application onto the skin every Monday, Tuesday, Wednesday, Thursday, and Friday. 0.2(0.16-0.04) mg/ml    . Magnesium 100 MG CAPS Take 100 mg by mouth every evening.    . Probiotic Product (PROBIOTIC-10 PO) Take by mouth.    . Progesterone Micronized (PROGESTERONE PO)  Take 1 capsule by mouth every Monday, Tuesday, Wednesday, Thursday, and Friday. 150 mg progesterone 5 mg dhea    . SUPER B COMPLEX/C PO Take 1 tablet by mouth every evening.    . TESTOSTERONE COMPOUNDING KIT TD Place 1 application onto the skin See admin instructions. 0.02%  0.90m /ml apply topically daily except on sundays    . Aspirin-Acetaminophen-Caffeine (GOODY HEADACHE PO) Take 1 packet by mouth daily as needed (headaches).    .Marland Kitchenibuprofen (ADVIL,MOTRIN) 200 MG tablet Take 200 mg by mouth daily as needed for moderate pain.    . metoprolol tartrate (LOPRESSOR) 25 MG tablet Take 0.5 tablets (12.5 mg total) by mouth as needed. 45 tablet 1   No current facility-administered medications for this visit.     Allergies:   Patient has no known allergies.     ROS:  Please see the history of present illness.   Otherwise, review of systems are positive for none.   All other systems are reviewed and negative.    PHYSICAL EXAM: VS:  BP (!) 149/91   Pulse 84   Temp 97.7 F (36.5 C)   Ht 5' 6" (1.676 m)   Wt 161 lb (73 kg)   SpO2 98%   BMI 25.99 kg/m  , BMI Body mass index is 25.99 kg/m. GENERAL:  Well appearing NECK:  No jugular venous  distention, waveform within normal limits, carotid upstroke brisk and symmetric, no bruits, no thyromegaly LYMPHATICS:  No cervical, inguinal adenopathy LUNGS:  Clear to auscultation bilaterally BACK:  No CVA tenderness CHEST:  Unremarkable HEART:  PMI not displaced or sustained,S1 and S2 within normal limits, no S3, no S4, no clicks, no rubs, NO murmurs ABD:  Flat, positive bowel sounds normal in frequency in pitch, no bruits, no rebound, no guarding, no midline pulsatile mass, no hepatomegaly, no splenomegaly EXT:  2 plus pulses throughout, no edema, no cyanosis no clubbing    EKG:  EKG is ordered today. The ekg ordered today demonstrates sinus rhythm, rate 69, axis normal limits, intervals within normal limits, no acute ST-T wave changes.   Recent  Labs: 05/31/2019: ALT 23; BUN 14; Creatinine, Ser 0.84; Hemoglobin 13.3; Platelets 237; Potassium 3.8; Sodium 140    Lipid Panel No results found for: CHOL, TRIG, HDL, CHOLHDL, VLDL, LDLCALC, LDLDIRECT    Wt Readings from Last 3 Encounters:  09/08/19 161 lb (73 kg)  05/26/19 158 lb (71.7 kg)  02/01/19 159 lb (72.1 kg)      Other studies Reviewed: Additional studies/ records that were reviewed today include: None. Review of the above records demonstrates:  Please see elsewhere in the note.     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   This seems to have been curative.  DYSLIPIDEMIA: I given her a goal LDL of less than 70 given the coronary calcium that she has.  We discussed at length risk factor modification.  ELEVATED CORONARY CALCIUM SCORE:  I had suggested a POET (Plain Old Exercise Treadmill) but deferred secondary to the pandemic.   She should have a treadmill at some point in time.  She was 95th percentile for her age and gender.  However, even during her rapid rate SVT she did not have symptoms of ischemia so I would suggest any plaque she has is unlikely to be obstructive.  We are going to pursue aggressive risk reduction and eventually a POET (Plain Old Exercise Treadmill)  HTN: She is can give me a 2-week blood pressure diary.  She says her blood pressure is usually low but has not been high at times.  I am suggesting she likely will need daily blood pressure medication regimen as needed.  Current medicines are reviewed at length with the patient today.  The patient does not have concerns regarding medicines.  The following changes have been made:  no change  Labs/ tests ordered today include: None  Orders Placed This Encounter  Procedures  . EKG 12-Lead     Disposition:   FU with in six months.     Signed, Minus Breeding, MD  09/08/2019 2:54 PM    [facility]

## 2019-09-08 ENCOUNTER — Encounter: Payer: Self-pay | Admitting: Cardiology

## 2019-09-08 ENCOUNTER — Other Ambulatory Visit: Payer: Self-pay

## 2019-09-08 ENCOUNTER — Ambulatory Visit (INDEPENDENT_AMBULATORY_CARE_PROVIDER_SITE_OTHER): Payer: Medicare Other | Admitting: Cardiology

## 2019-09-08 VITALS — BP 149/91 | HR 84 | Temp 97.7°F | Ht 66.0 in | Wt 161.0 lb

## 2019-09-08 DIAGNOSIS — I471 Supraventricular tachycardia: Secondary | ICD-10-CM

## 2019-09-08 DIAGNOSIS — R931 Abnormal findings on diagnostic imaging of heart and coronary circulation: Secondary | ICD-10-CM

## 2019-09-08 DIAGNOSIS — E785 Hyperlipidemia, unspecified: Secondary | ICD-10-CM

## 2019-09-08 MED ORDER — METOPROLOL TARTRATE 25 MG PO TABS: 12.5000 mg | ORAL_TABLET | ORAL | 1 refills | Status: DC | PRN

## 2019-09-08 NOTE — Patient Instructions (Addendum)
Medication Instructions:   START Metoprolol Tartrate 12.5 mg as needed  *If you need a refill on your cardiac medications before your next appointment, please call your pharmacy*  Lab Work: NONE ordered at this time of appointment   If you have labs (blood work) drawn today and your tests are completely normal, you will receive your results only by: Marland Kitchen MyChart Message (if you have MyChart) OR . A paper copy in the mail If you have any lab test that is abnormal or we need to change your treatment, we will call you to review the results.  Testing/Procedures: NONE ordered at this time of appointment   Follow-Up: At [facility], you and your health needs are our priority.  As part of our continuing mission to provide you with exceptional heart care, we have created designated Provider Care Teams.  These Care Teams include your primary Cardiologist (physician) and Advanced Practice Providers (APPs -  Physician Assistants and Nurse Practitioners) who all work together to provide you with the care you need, when you need it.  Your next appointment:   6 months  The format for your next appointment:   In Person  Provider:   You may see Minus Breeding, MD or one of the following Advanced Practice Providers on your designated Care Team:    Rosaria Ferries, PA-C  Jory Sims, DNP, ANP  Cadence Kathlen Mody, NP  Other Instructions  Monitor blood pressure for 2 weeks

## 2019-09-26 ENCOUNTER — Other Ambulatory Visit: Payer: Self-pay

## 2019-09-26 ENCOUNTER — Encounter: Payer: Self-pay | Admitting: Internal Medicine

## 2019-09-26 ENCOUNTER — Ambulatory Visit (INDEPENDENT_AMBULATORY_CARE_PROVIDER_SITE_OTHER): Payer: Medicare Other | Admitting: Internal Medicine

## 2019-09-26 VITALS — BP 108/66 | HR 59 | Temp 98.3°F | Ht 66.0 in | Wt 162.2 lb

## 2019-09-26 DIAGNOSIS — E663 Overweight: Secondary | ICD-10-CM | POA: Diagnosis not present

## 2019-09-26 DIAGNOSIS — E785 Hyperlipidemia, unspecified: Secondary | ICD-10-CM

## 2019-09-26 DIAGNOSIS — N951 Menopausal and female climacteric states: Secondary | ICD-10-CM | POA: Diagnosis not present

## 2019-09-26 DIAGNOSIS — Z79899 Other long term (current) drug therapy: Secondary | ICD-10-CM | POA: Diagnosis not present

## 2019-09-26 DIAGNOSIS — Z6826 Body mass index (BMI) 26.0-26.9, adult: Secondary | ICD-10-CM

## 2019-09-26 DIAGNOSIS — R931 Abnormal findings on diagnostic imaging of heart and coronary circulation: Secondary | ICD-10-CM

## 2019-09-26 NOTE — Patient Instructions (Signed)

## 2019-09-27 LAB — CBC
Hematocrit: 39.2 % (ref 34.0–46.6)
Hemoglobin: 13 g/dL (ref 11.1–15.9)
MCH: 30.4 pg (ref 26.6–33.0)
MCHC: 33.2 g/dL (ref 31.5–35.7)
MCV: 92 fL (ref 79–97)
Platelets: 261 10*3/uL (ref 150–450)
RBC: 4.27 x10E6/uL (ref 3.77–5.28)
RDW: 12.5 % (ref 11.7–15.4)
WBC: 7.3 10*3/uL (ref 3.4–10.8)

## 2019-09-27 LAB — TESTOSTERONE: Testosterone: 8 ng/dL (ref 3–41)

## 2019-09-30 NOTE — Progress Notes (Signed)
Subjective:     Patient ID: Andrea Mendoza , female    DOB: 16-Feb-1956 , 63 y.o.   MRN: [id number]   Chief Complaint  Patient presents with  . hormones f/u    HPI  She is here today for f/u BHRT therapy. She reports compliance with her hormone regimen.  She has been taking Biest 1/2cc nightly except Sundays, Progesterone/DHEA nightly except Sundays and topical testosterone five days per week. She has not had any issues with this regimen.     Past Medical History:  Diagnosis Date  . Abnormal vaginal Pap smear   . History of fibrocystic disease of breast   . Hx of migraines   . Kidney stones   . Osteopenia   . Post menopausal problems   . Transient hypertension      Family History  Problem Relation Age of Onset  . Heart attack Father   . Heart attack Mother   . Dementia Other   . CVA Other   . CAD Other   . Osteoporosis Other   . Stroke Other        X 2  . Colon polyps Other   . CVA Other   . CAD Other   . Breast cancer Other      Current Outpatient Medications:  .  atorvastatin (LIPITOR) 40 MG tablet, TAKE 1 TABLET BY MOUTH ONCE DAILY (Patient taking differently: Take 40 mg by mouth every evening. ), Disp: 90 tablet, Rfl: 3 .  Cholecalciferol (VITAMIN D) 50 MCG (2000 UT) tablet, Take 2,000 Units by mouth at bedtime., Disp: , Rfl:  .  Estradiol-Estriol-Progesterone (BIEST/PROGESTERONE) CREA, Place 1 application onto the skin every Monday, Tuesday, Wednesday, Thursday, and Friday. 0.2(0.16-0.04) mg/ml, Disp: , Rfl:  .  Magnesium 100 MG CAPS, Take 100 mg by mouth every evening., Disp: , Rfl:  .  metoprolol tartrate (LOPRESSOR) 25 MG tablet, Take 0.5 tablets (12.5 mg total) by mouth as needed., Disp: 45 tablet, Rfl: 1 .  omeprazole (PRILOSEC OTC) 20 MG tablet, Take 20 mg by mouth daily., Disp: , Rfl:  .  Probiotic Product (PROBIOTIC-10 PO), Take by mouth., Disp: , Rfl:  .  Progesterone Micronized (PROGESTERONE PO), Take 1 capsule by mouth every Monday, Tuesday, Wednesday,  Thursday, and Friday. 150 mg progesterone 5 mg dhea, Disp: , Rfl:  .  SUPER B COMPLEX/C PO, Take 1 tablet by mouth every evening., Disp: , Rfl:  .  TESTOSTERONE COMPOUNDING KIT TD, Place 1 application onto the skin See admin instructions. 0.02%  0.66m /ml apply topically daily except on sundays, Disp: , Rfl:    No Known Allergies   Review of Systems  Constitutional: Negative.   Respiratory: Negative.   Cardiovascular: Negative.   Gastrointestinal: Negative.   Neurological: Negative.   Psychiatric/Behavioral: Negative.      Today's Vitals   09/26/19 1514  BP: 108/66  Pulse: (!) 59  Temp: 98.3 F (36.8 C)  TempSrc: Oral  Weight: 162 lb 3.2 oz (73.6 kg)  Height: _0  (1.676 m)  PainSc: 0-No pain   Body mass index is 26.18 kg/m.   Objective:  Physical Exam Vitals signs and nursing note reviewed.  Constitutional:      Appearance: Normal appearance.  HENT:     Head: Normocephalic and atraumatic.  Cardiovascular:     Rate and Rhythm: Normal rate and regular rhythm.     Heart sounds: Normal heart sounds.  Pulmonary:     Effort: Pulmonary effort is normal.  Breath sounds: Normal breath sounds.  Skin:    General: Skin is warm.  Neurological:     General: No focal deficit present.     Mental Status: She is alert.  Psychiatric:        Mood and Affect: Mood normal.        Behavior: Behavior normal.         Assessment And Plan:     1. Female climacteric state  She will continue with current regimen as discussed above. She will rto in 4 months for re-evaluation. I will call in refills into [facility]. All questions were answered to her satisfaction.   2. Dyslipidemia  We also discussed her recent chol results from her PCP. We discussed goal LDL and lifestyle changes that need to be made to help her achieve this goal.  She will continue with statin as per PCP. Greater than 50% of face to face time was spent in counseling and coordination of care. I spent  more than 25 minutes with the patient.   3. Drug therapy  - Testosterone, Total - CBC no Diff    4. Overweight with body mass index (BMI) of 26 to 26.9 in adult  She was congratulated on her weight loss thus far and encouraged to keep up the great work. She is encouraged to exercise at least 150 minutes per week and to strive for BMI less than 25 to decrease cardiac risk.    Maximino Greenland, MD    THE PATIENT IS ENCOURAGED TO PRACTICE SOCIAL DISTANCING DUE TO THE COVID-19 PANDEMIC.

## 2019-11-20 ENCOUNTER — Other Ambulatory Visit: Payer: Self-pay | Admitting: Cardiology

## 2019-12-29 ENCOUNTER — Other Ambulatory Visit: Payer: Self-pay | Admitting: Family Medicine

## 2019-12-29 DIAGNOSIS — Z1231 Encounter for screening mammogram for malignant neoplasm of breast: Secondary | ICD-10-CM

## 2020-01-24 ENCOUNTER — Ambulatory Visit: Payer: Medicare Other | Admitting: Internal Medicine

## 2020-02-14 ENCOUNTER — Other Ambulatory Visit: Payer: Self-pay

## 2020-02-14 ENCOUNTER — Ambulatory Visit
Admission: RE | Admit: 2020-02-14 | Discharge: 2020-02-14 | Disposition: A | Discharge status: Home / Self Care | Payer: Medicare Other | Source: Ambulatory Visit | Attending: Family Medicine | Admitting: Family Medicine

## 2020-02-14 DIAGNOSIS — Z1231 Encounter for screening mammogram for malignant neoplasm of breast: Secondary | ICD-10-CM

## 2020-03-15 ENCOUNTER — Encounter: Payer: Self-pay | Admitting: Cardiology

## 2020-04-19 DIAGNOSIS — I10 Essential (primary) hypertension: Secondary | ICD-10-CM | POA: Insufficient documentation

## 2020-04-19 NOTE — Progress Notes (Signed)
Cardiology Office Note   Date:  04/20/2020   ID:  Andrea Mendoza, DOB December 31, 1955, MRN [id number]  PCP:  Orpah Melter, MD  Cardiologist:   Minus Breeding, MD   Chief Complaint  Patient presents with  . Coronary Artery Disease    History of Present Illness: Andrea Mendoza is a 64 y.o. female who presents for evaluation of SVT and coronary calcium.  Since I last saw her she has done well. The patient denies any new symptoms such as chest discomfort, neck or arm discomfort. There has been no new shortness of breath, PND or orthopnea. There have been no reported palpitations, presyncope or syncope.  She has been exercising walking 5 to 6 miles per day.  She had 1 day where she felt exhausted and had some indigestion-like symptoms with a significant amount of stress.  However, this was only 1 day.   Past Medical History:  Diagnosis Date  . Abnormal vaginal Pap smear   . History of fibrocystic disease of breast   . Hx of migraines   . Kidney stones   . Osteopenia   . Post menopausal problems   . Transient hypertension     Past Surgical History:  Procedure Laterality Date  . BREAST BIOPSY Right   . SVT ABLATION N/A 01/05/2019   Procedure: SVT ABLATION;  Surgeon: Constance Haw, MD;  Location: [facility];  Service: Cardiovascular;  Laterality: N/A;  . VAGINAL HYSTERECTOMY       Current Outpatient Medications  Medication Sig Dispense Refill  . atorvastatin (LIPITOR) 40 MG tablet Take 1 tablet by mouth once daily 90 tablet 3  . Cholecalciferol (VITAMIN D) 50 MCG (2000 UT) tablet Take 2,000 Units by mouth at bedtime.    . Estradiol-Estriol-Progesterone (BIEST/PROGESTERONE) CREA Place 1 application onto the skin every Monday, Tuesday, Wednesday, Thursday, and Friday. 0.2(0.16-0.04) mg/ml    . metoprolol tartrate (LOPRESSOR) 25 MG tablet Take 0.5 tablets (12.5 mg total) by mouth as needed. 45 tablet 1  . Probiotic Product (PROBIOTIC-10 PO) Take by mouth.    .  Progesterone Micronized (PROGESTERONE PO) Take 1 capsule by mouth every Monday, Tuesday, Wednesday, Thursday, and Friday. 150 mg progesterone 5 mg dhea    . SUPER B COMPLEX/C PO Take 1 tablet by mouth every evening.    . TESTOSTERONE COMPOUNDING KIT TD Place 1 application onto the skin See admin instructions. 0.02%  0.31m /ml apply topically daily except on sundays     No current facility-administered medications for this visit.    Allergies:   Patient has no known allergies.     ROS:  Please see the history of present illness.   Otherwise, review of systems are positive for none.   All other systems are reviewed and negative.    PHYSICAL EXAM: VS:  BP 112/69   Pulse 75   Ht 5' 6"  (1.676 m)   Wt 155 lb (70.3 kg)   SpO2 98%   BMI 25.02 kg/m  , BMI Body mass index is 25.02 kg/m. GENERAL:  Well appearing NECK:  No jugular venous distention, waveform within normal limits, carotid upstroke brisk and symmetric, no bruits, no thyromegaly LUNGS:  Clear to auscultation bilaterally CHEST:  Unremarkable HEART:  PMI not displaced or sustained,S1 and S2 within normal limits, no S3, no S4, no clicks, no rubs, no murmurs ABD:  Flat, positive bowel sounds normal in frequency in pitch, no bruits, no rebound, no guarding, no midline pulsatile mass, no hepatomegaly,  no splenomegaly EXT:  2 plus pulses throughout, no edema, no cyanosis no clubbing  EKG:  EKG is  ordered today. The ekg ordered today demonstrates sinus rhythm, rate 74, axis normal limits, intervals within normal limits, no acute ST-T wave changes.   Recent Labs: 05/31/2019: ALT 23; BUN 14; Creatinine, Ser 0.84; Potassium 3.8; Sodium 140 09/26/2019: Hemoglobin 13.0; Platelets 261    Lipid Panel No results found for: CHOL, TRIG, HDL, CHOLHDL, VLDL, LDLCALC, LDLDIRECT    Wt Readings from Last 3 Encounters:  04/20/20 155 lb (70.3 kg)  09/26/19 162 lb 3.2 oz (73.6 kg)  09/08/19 161 lb (73 kg)      Other studies  Reviewed: Additional studies/ records that were reviewed today include: Labs. Review of the above records demonstrates:  Please see elsewhere in the note.     ASSESSMENT AND PLAN:  AVNRT:   She has had no recurrence of this since ablation.  DYSLIPIDEMIA:   LDL is not quite at 70.  Him to repeat this and have a low threshold to increase her Lipitor or change to Crestor.   ELEVATED CORONARY CALCIUM SCORE: I will repeat a POET (Plain Old Exercise Treadmill)  HTN: I reviewed her blood pressure diary and she was at target.  Current medicines are reviewed at length with the patient today.  The patient does not have concerns regarding medicines.  The following changes have been made:  None  Labs/ tests ordered today include:   Orders Placed This Encounter  Procedures  . Lipid panel  . EXERCISE TOLERANCE TEST (ETT)  . EKG 12-Lead     Disposition:   FU with in 12 months.     Signed, Minus Breeding, MD  04/20/2020 8:59 AM    [facility]

## 2020-04-20 ENCOUNTER — Encounter: Payer: Self-pay | Admitting: *Deleted

## 2020-04-20 ENCOUNTER — Encounter: Payer: Self-pay | Admitting: Cardiology

## 2020-04-20 ENCOUNTER — Other Ambulatory Visit: Payer: Self-pay

## 2020-04-20 ENCOUNTER — Ambulatory Visit (INDEPENDENT_AMBULATORY_CARE_PROVIDER_SITE_OTHER): Payer: Medicare Other | Admitting: Cardiology

## 2020-04-20 VITALS — BP 112/69 | HR 75 | Ht 66.0 in | Wt 155.0 lb

## 2020-04-20 DIAGNOSIS — R931 Abnormal findings on diagnostic imaging of heart and coronary circulation: Secondary | ICD-10-CM | POA: Diagnosis not present

## 2020-04-20 DIAGNOSIS — I471 Supraventricular tachycardia: Secondary | ICD-10-CM

## 2020-04-20 DIAGNOSIS — I10 Essential (primary) hypertension: Secondary | ICD-10-CM

## 2020-04-20 DIAGNOSIS — E785 Hyperlipidemia, unspecified: Secondary | ICD-10-CM | POA: Diagnosis not present

## 2020-04-20 NOTE — Patient Instructions (Signed)
Medication Instructions:  NO CHANGES *If you need a refill on your cardiac medications before your next appointment, please call your pharmacy*  Lab Work: Your physician recommends that you return for lab work (FASTING LIPIDS) If you have labs (blood work) drawn and your tests are completely normal, you will receive your results only by: Marland Kitchen MyChart Message (if you have MyChart) OR . A paper copy in the mail If you have any lab test that is abnormal or we need to change your treatment, we will call you to review the results.  Testing/Procedures: Your physician has requested that you have an exercise tolerance test. For further information please visit [email or web address] Please also follow instruction sheet, as given.  Follow-Up: At [facility], you and your health needs are our priority.  As part of our continuing mission to provide you with exceptional heart care, we have created designated Provider Care Teams.  These Care Teams include your primary Cardiologist (physician) and Advanced Practice Providers (APPs -  Physician Assistants and Nurse Practitioners) who all work together to provide you with the care you need, when you need it.  Your next appointment:   12 month(s)  You will receive a reminder letter in the mail two months in advance. If you don't receive a letter, please call our office to schedule the follow-up appointment.  The format for your next appointment:   In Person  Provider:   Rollene Rotunda, MD

## 2020-04-24 ENCOUNTER — Other Ambulatory Visit: Payer: Self-pay

## 2020-04-24 ENCOUNTER — Encounter: Payer: Self-pay | Admitting: Internal Medicine

## 2020-04-24 ENCOUNTER — Ambulatory Visit (INDEPENDENT_AMBULATORY_CARE_PROVIDER_SITE_OTHER): Payer: Medicare Other | Admitting: Internal Medicine

## 2020-04-24 VITALS — BP 114/70 | HR 70 | Temp 98.7°F | Ht 66.0 in | Wt 155.4 lb

## 2020-04-24 DIAGNOSIS — E2839 Other primary ovarian failure: Secondary | ICD-10-CM | POA: Diagnosis not present

## 2020-04-24 DIAGNOSIS — N951 Menopausal and female climacteric states: Secondary | ICD-10-CM | POA: Diagnosis not present

## 2020-04-24 DIAGNOSIS — R931 Abnormal findings on diagnostic imaging of heart and coronary circulation: Secondary | ICD-10-CM

## 2020-04-24 DIAGNOSIS — Z79899 Other long term (current) drug therapy: Secondary | ICD-10-CM | POA: Diagnosis not present

## 2020-04-24 NOTE — Patient Instructions (Signed)
Bone Density Test The bone density test uses a special type of X-ray to measure the amount of calcium and other minerals in your bones. It can measure bone density in the hip and the spine. The test procedure is similar to having a regular X-ray. This test may also be called:  Bone densitometry.  Bone mineral density test.  Dual-energy X-ray absorptiometry (DEXA). You may have this test to:  Diagnose a condition that causes weak or thin bones (osteoporosis).  Screen you for osteoporosis.  Predict your risk for a broken bone (fracture).  Determine how well your osteoporosis treatment is working. Tell a health care provider about:  Any allergies you have.  All medicines you are taking, including vitamins, herbs, eye drops, creams, and over-the-counter medicines.  Any problems you or family members have had with anesthetic medicines.  Any blood disorders you have.  Any surgeries you have had.  Any medical conditions you have.  Whether you are pregnant or may be pregnant.  Any medical tests you have had within the past 14 days that used contrast material. What are the risks? Generally, this is a safe procedure. However, it does expose you to a small amount of radiation, which can slightly increase your cancer risk. What happens before the procedure?  Do not take any calcium supplements starting 24 hours before your test.  Remove all metal jewelry, eyeglasses, dental appliances, and any other metal objects. What happens during the procedure?   You will lie down on an exam table. There will be an X-ray generator below you and an imaging device above you.  Other devices, such as boxes or braces, may be used to position your body properly for the scan.  The machine will slowly scan your body. You will need to keep still.  The images will show up on a screen in the room. Images will be examined by a specialist after your test is done. The procedure may vary among health care  providers and hospitals. What happens after the procedure?  It is up to you to get your test results. Ask your health care provider, or the department that is doing the test, when your results will be ready. Summary  A bone density test is an imaging test that uses a type of X-ray to measure the amount of calcium and other minerals in your bones.  The test may be used to diagnose or screen you for a condition that causes weak or thin bones (osteoporosis), predict your risk for a broken bone (fracture), or determine how well your osteoporosis treatment is working.  Do not take any calcium supplements starting 24 hours before your test.  Ask your health care provider, or the department that is doing the test, when your results will be ready. This information is not intended to replace advice given to you by your health care provider. Make sure you discuss any questions you have with your health care provider. Document Revised: 11/05/2017 Document Reviewed: 08/24/2017 Elsevier Patient Education  2020 Elsevier Inc.  

## 2020-04-25 LAB — CMP14+EGFR
ALT: 16 IU/L (ref 0–32)
AST: 25 IU/L (ref 0–40)
Albumin/Globulin Ratio: 1.6 (ref 1.2–2.2)
Albumin: 4.2 g/dL (ref 3.8–4.8)
Alkaline Phosphatase: 77 IU/L (ref 48–121)
BUN/Creatinine Ratio: 15 (ref 12–28)
BUN: 13 mg/dL (ref 8–27)
Bilirubin Total: 0.6 mg/dL (ref 0.0–1.2)
CO2: 23 mmol/L (ref 20–29)
Calcium: 9.4 mg/dL (ref 8.7–10.3)
Chloride: 105 mmol/L (ref 96–106)
Creatinine, Ser: 0.86 mg/dL (ref 0.57–1.00)
GFR calc Af Amer: 81 mL/min/{1.73_m2} (ref >59)
GFR calc non Af Amer: 71 mL/min/{1.73_m2} (ref >59)
Globulin, Total: 2.6 g/dL (ref 1.5–4.5)
Glucose: 78 mg/dL (ref 65–99)
Potassium: 4.3 mmol/L (ref 3.5–5.2)
Sodium: 142 mmol/L (ref 134–144)
Total Protein: 6.8 g/dL (ref 6.0–8.5)

## 2020-04-25 LAB — CBC
Hematocrit: 41 % (ref 34.0–46.6)
Hemoglobin: 13.4 g/dL (ref 11.1–15.9)
MCH: 30 pg (ref 26.6–33.0)
MCHC: 32.7 g/dL (ref 31.5–35.7)
MCV: 92 fL (ref 79–97)
Platelets: 270 10*3/uL (ref 150–450)
RBC: 4.47 x10E6/uL (ref 3.77–5.28)
RDW: 13 % (ref 11.7–15.4)
WBC: 7.3 10*3/uL (ref 3.4–10.8)

## 2020-04-25 LAB — TESTOSTERONE: Testosterone: 6 ng/dL (ref 3–67)

## 2020-04-25 NOTE — Progress Notes (Signed)
This visit occurred during the SARS-CoV-2 public health emergency.  Safety protocols were in place, including screening questions prior to the visit, additional usage of staff PPE, and extensive cleaning of exam room while observing appropriate contact time as indicated for disinfecting solutions.  Subjective:     Patient ID: Andrea Mendoza , female    DOB: 10/31/1956 , 64 y.o.   MRN: [id number]   Chief Complaint  Patient presents with  . Hormones f/u    HPI  She presents today for f/u BHRT.  She has been using compounded Bi-est, testosterone and progesterone/DHEA capsules. She has not had any issues with this regimen. She reports feeling great. She is not having any hot flashes at this time. Unfortunately, her Mom has passed since her last visit.     Past Medical History:  Diagnosis Date  . Abnormal vaginal Pap smear   . History of fibrocystic disease of breast   . Hx of migraines   . Kidney stones   . Osteopenia   . Post menopausal problems   . Transient hypertension      Family History  Problem Relation Age of Onset  . Heart attack Father   . Heart attack Mother   . Dementia Other   . CVA Other   . CAD Other   . Osteoporosis Other   . Stroke Other        X 2  . Colon polyps Other   . CVA Other   . CAD Other   . Breast cancer Other      Current Outpatient Medications:  .  atorvastatin (LIPITOR) 40 MG tablet, Take 1 tablet by mouth once daily, Disp: 90 tablet, Rfl: 3 .  Cholecalciferol (VITAMIN D) 50 MCG (2000 UT) tablet, Take 2,000 Units by mouth at bedtime., Disp: , Rfl:  .  Estradiol-Estriol-Progesterone (BIEST/PROGESTERONE) CREA, Place 1 application onto the skin every Monday, Tuesday, Wednesday, Thursday, and Friday. 0.2(0.16-0.04) mg/ml Monday - Saturday, Disp: , Rfl:  .  metoprolol tartrate (LOPRESSOR) 25 MG tablet, Take 0.5 tablets (12.5 mg total) by mouth as needed., Disp: 45 tablet, Rfl: 1 .  Progesterone Micronized (PROGESTERONE PO), Take 1 capsule by mouth  every Monday, Tuesday, Wednesday, Thursday, and Friday. 150 mg progesterone 5 mg dhea, Disp: , Rfl:  .  SUPER B COMPLEX/C PO, Take 1 tablet by mouth every evening., Disp: , Rfl:  .  TESTOSTERONE COMPOUNDING KIT TD, Place 1 application onto the skin See admin instructions. 0.02%  0.14m /ml apply topically daily except on sundays, Disp: , Rfl:    No Known Allergies   Review of Systems  Constitutional: Negative.   Respiratory: Negative.   Cardiovascular: Negative.   Gastrointestinal: Negative.   Neurological: Negative.   Psychiatric/Behavioral: Negative.      Today's Vitals   04/24/20 1452  BP: 114/70  Pulse: 70  Temp: 98.7 F (37.1 C)  TempSrc: Oral  Weight: 155 lb 6.4 oz (70.5 kg)  Height: 5' 6"  (1.676 m)  PainSc: 0-No pain   Body mass index is 25.08 kg/m.   Objective:  Physical Exam Vitals and nursing note reviewed.  Constitutional:      Appearance: Normal appearance.  HENT:     Head: Normocephalic and atraumatic.  Cardiovascular:     Rate and Rhythm: Normal rate and regular rhythm.     Heart sounds: Normal heart sounds.  Pulmonary:     Effort: Pulmonary effort is normal.     Breath sounds: Normal breath sounds.  Skin:  General: Skin is warm.  Neurological:     General: No focal deficit present.     Mental Status: She is alert.  Psychiatric:        Mood and Affect: Mood normal.        Behavior: Behavior normal.         Assessment And Plan:     1. Female climacteric state  Chronic, she will continue with current meds for now. She will rto in four months for re-evaluation. I suspect she is due for repeat saliva kit, I will check her records. I will call refills into [facility]. I will check serum levels of testosterone today, along with CMP and CBC.   2. Estrogen deficiency  She agrees to have bone density in the future. I will place referral [facility] and request that she have scheduled the same day as her next mammogram. She was  encouraged to continue with her regular walking regimen, along with calcium/vitamin D supplementation.   - DG Bone Density; Future  3. Drug therapy  - CMP14+EGFR - CBC no Diff - Testosterone, Total     Maximino Greenland, MD    THE PATIENT IS ENCOURAGED TO PRACTICE SOCIAL DISTANCING DUE TO THE COVID-19 PANDEMIC.

## 2020-04-26 ENCOUNTER — Encounter: Payer: Self-pay | Admitting: Internal Medicine

## 2020-05-02 ENCOUNTER — Telehealth (HOSPITAL_COMMUNITY): Payer: Self-pay

## 2020-05-02 NOTE — Telephone Encounter (Signed)
Encounter complete. 

## 2020-05-04 ENCOUNTER — Other Ambulatory Visit: Payer: Self-pay

## 2020-05-04 ENCOUNTER — Ambulatory Visit (HOSPITAL_COMMUNITY)
Admission: RE | Admit: 2020-05-04 | Discharge: 2020-05-04 | Disposition: A | Discharge status: Home / Self Care | Payer: Medicare Other | Source: Ambulatory Visit | Attending: Cardiology | Admitting: Cardiology

## 2020-05-04 DIAGNOSIS — R931 Abnormal findings on diagnostic imaging of heart and coronary circulation: Secondary | ICD-10-CM | POA: Insufficient documentation

## 2020-05-04 LAB — EXERCISE TOLERANCE TEST
Estimated workload: 10.1 METS
Exercise duration (min): 8 min
Exercise duration (sec): 8 s
MPHR: 154 {beats}/min
Peak HR: 160 {beats}/min
Percent HR: 103 %
Rest HR: 67 {beats}/min

## 2020-08-27 ENCOUNTER — Other Ambulatory Visit: Payer: Self-pay

## 2020-08-27 ENCOUNTER — Ambulatory Visit
Admission: RE | Admit: 2020-08-27 | Discharge: 2020-08-27 | Disposition: A | Discharge status: Home / Self Care | Payer: Medicare Other | Source: Ambulatory Visit | Attending: Internal Medicine | Admitting: Internal Medicine

## 2020-08-27 DIAGNOSIS — E2839 Other primary ovarian failure: Secondary | ICD-10-CM

## 2020-08-28 ENCOUNTER — Encounter: Payer: Self-pay | Admitting: Internal Medicine

## 2020-08-28 ENCOUNTER — Ambulatory Visit (INDEPENDENT_AMBULATORY_CARE_PROVIDER_SITE_OTHER): Payer: Medicare Other | Admitting: Internal Medicine

## 2020-08-28 VITALS — BP 110/66 | HR 62 | Temp 97.9°F | Ht 66.0 in | Wt 155.9 lb

## 2020-08-28 DIAGNOSIS — R931 Abnormal findings on diagnostic imaging of heart and coronary circulation: Secondary | ICD-10-CM | POA: Diagnosis not present

## 2020-08-28 DIAGNOSIS — M810 Age-related osteoporosis without current pathological fracture: Secondary | ICD-10-CM | POA: Diagnosis not present

## 2020-08-28 DIAGNOSIS — N951 Menopausal and female climacteric states: Secondary | ICD-10-CM

## 2020-08-28 DIAGNOSIS — Z79899 Other long term (current) drug therapy: Secondary | ICD-10-CM | POA: Diagnosis not present

## 2020-08-28 NOTE — Patient Instructions (Signed)
Vitamin D with K2  Osteoporosis  Osteoporosis happens when your bones get thin and weak. This can cause your bones to break (fracture) more easily. You can do things at home to make your bones stronger. Follow these instructions at home:  Activity  Exercise as told by your doctor. Ask your doctor what activities are safe for you. You should do: ? Exercises that make your muscles work to hold your body weight up (weight-bearing exercises). These include tai chi, yoga, and walking. ? Exercises to make your muscles stronger. One example is lifting weights. Lifestyle  Limit alcohol intake to no more than 1 drink a day for nonpregnant women and 2 drinks a day for men. One drink equals 12 oz of beer, 5 oz of wine, or 1 oz of hard liquor.  Do not use any products that have nicotine or tobacco in them. These include cigarettes and e-cigarettes. If you need help quitting, ask your doctor. Preventing falls  Use tools to help you move around (mobility aids) as needed. These include canes, walkers, scooters, and crutches.  Keep rooms well-lit and free of clutter.  Put away things that could make you trip. These include cords and rugs.  Install safety rails on stairs. Install grab bars in bathrooms.  Use rubber mats in slippery areas, like bathrooms.  Wear shoes that: ? Fit you well. ? Support your feet. ? Have closed toes. ? Have rubber soles or low heels.  Tell your doctor about all of the medicines you are taking. Some medicines can make you more likely to fall. General instructions  Eat plenty of calcium and vitamin D. These nutrients are good for your bones. Good sources of calcium and vitamin D include: ? Some fatty fish, such as salmon and tuna. ? Foods that have calcium and vitamin D added to them (fortified foods). For example, some breakfast cereals are fortified with calcium and vitamin D. ? Egg yolks. ? Cheese. ? Liver.  Take over-the-counter and prescription medicines  only as told by your doctor.  Keep all follow-up visits as told by your doctor. This is important. Contact a doctor if:  You have not been tested (screened) for osteoporosis and you are: ? A woman who is age 65 or older. ? A man who is age 70 or older. Get help right away if:  You fall.  You get hurt. Summary  Osteoporosis happens when your bones get thin and weak.  Weak bones can break (fracture) more easily.  Eat plenty of calcium and vitamin D. These nutrients are good for your bones.  Tell your doctor about all of the medicines that you take. This information is not intended to replace advice given to you by your health care provider. Make sure you discuss any questions you have with your health care provider. Document Revised: 10/02/2017 Document Reviewed: 08/14/2017 Elsevier Patient Education  2020 Elsevier Inc.  

## 2020-08-28 NOTE — Progress Notes (Signed)
I,Katawbba Wiggins,acting as a Education administrator for Maximino Greenland, MD.,have documented all relevant documentation on the behalf of Maximino Greenland, MD,as directed by  Maximino Greenland, MD while in the presence of Maximino Greenland, MD.  This visit occurred during the SARS-CoV-2 public health emergency.  Safety protocols were in place, including screening questions prior to the visit, additional usage of staff PPE, and extensive cleaning of exam room while observing appropriate contact time as indicated for disinfecting solutions.  Subjective:     Patient ID: Andrea Mendoza , female    DOB: July 11, 1956 , 64 y.o.   MRN: [id number]   Chief Complaint  Patient presents with  . Hormones f/u    HPI  She presents today for f/u BHRT. She reports compliance with her BHRT regimen. She has not noticed any side effects. She is still getting yearly mammograms. Had bone density performed yesterday.     Past Medical History:  Diagnosis Date  . Abnormal vaginal Pap smear   . History of fibrocystic disease of breast   . Hx of migraines   . Kidney stones   . Osteopenia   . Post menopausal problems   . Transient hypertension      Family History  Problem Relation Age of Onset  . Heart attack Father   . Heart attack Mother   . Dementia Other   . CVA Other   . CAD Other   . Osteoporosis Other   . Stroke Other        X 2  . Colon polyps Other   . CVA Other   . CAD Other   . Breast cancer Other      Current Outpatient Medications:  .  atorvastatin (LIPITOR) 40 MG tablet, Take 1 tablet by mouth once daily, Disp: 90 tablet, Rfl: 3 .  Cholecalciferol (VITAMIN D) 50 MCG (2000 UT) tablet, Take 2,000 Units by mouth at bedtime., Disp: , Rfl:  .  Estradiol-Estriol-Progesterone (BIEST/PROGESTERONE) CREA, Place 1 application onto the skin every Monday, Tuesday, Wednesday, Thursday, and Friday. 0.2(0.16-0.04) mg/ml Monday - Saturday, Disp: , Rfl:  .  metoprolol tartrate (LOPRESSOR) 25 MG tablet, Take 0.5 tablets  (12.5 mg total) by mouth as needed., Disp: 45 tablet, Rfl: 1 .  Progesterone Micronized (PROGESTERONE PO), Take 1 capsule by mouth every Monday, Tuesday, Wednesday, Thursday, and Friday. 150 mg progesterone 5 mg dhea, Disp: , Rfl:  .  SUPER B COMPLEX/C PO, Take 1 tablet by mouth every evening. (Patient not taking: Reported on 08/28/2020), Disp: , Rfl:  .  TESTOSTERONE COMPOUNDING KIT TD, Place 1 application onto the skin See admin instructions. 0.02%  0.68m /ml apply topically daily except on sundays  Monday - FRIDAY, Disp: , Rfl:    No Known Allergies   Review of Systems  Constitutional: Negative.   Respiratory: Negative.   Cardiovascular: Negative.   Gastrointestinal: Negative.   Psychiatric/Behavioral: Negative.   All other systems reviewed and are negative.    Today's Vitals   08/28/20 1455  BP: 110/66  Pulse: 62  Temp: 97.9 F (36.6 C)  TempSrc: Oral  Weight: 155 lb 14.4 oz (70.7 kg)  Height: _0  (1.676 m)   Body mass index is 25.16 kg/m.   Objective:  Physical Exam Vitals and nursing note reviewed.  Constitutional:      Appearance: Normal appearance.  HENT:     Head: Normocephalic and atraumatic.  Cardiovascular:     Rate and Rhythm: Normal rate and regular rhythm.  Heart sounds: Normal heart sounds.  Pulmonary:     Breath sounds: Normal breath sounds.  Skin:    General: Skin is warm.  Neurological:     General: No focal deficit present.     Mental Status: She is alert and oriented to person, place, and time.         Assessment And Plan:     1. Female climacteric state Comments: Chronic.She will c/w current meds. I will check testosteorne level today. She has CPE scheduled with PCP next month. She agrees to forward lab results to me for my review. I plan to review CBC and CMP results at that time.  - Testosterone, Total  2. Osteoporosis of femur without pathological fracture Comments: We discussed her results in full detail. I will forward a copy of  results to her PCP, Dr. Olen Pel. Various treatment options d/w pt. She is encouraged to d/w PCP further so he can help guide her treatment plan. Importance of calcium, vitamin D and K2 supplementation was discussed with the patient. All questions were answered to her satisfaction.   3. Drug therapy - Vitamin B12 - TSH     Patient was given opportunity to ask questions. Patient verbalized understanding of the plan and was able to repeat key elements of the plan. All questions were answered to their satisfaction.  Maximino Greenland, MD   I, Maximino Greenland, MD, have reviewed all documentation for this visit. The documentation on 08/28/20 for the exam, diagnosis, procedures, and orders are all accurate and complete.  THE PATIENT IS ENCOURAGED TO PRACTICE SOCIAL DISTANCING DUE TO THE COVID-19 PANDEMIC.

## 2020-08-29 ENCOUNTER — Encounter: Payer: Self-pay | Admitting: Family Medicine

## 2020-08-29 LAB — TESTOSTERONE: Testosterone: 10 ng/dL (ref 3–67)

## 2020-08-29 LAB — TSH: TSH: 2.32 u[IU]/mL (ref 0.450–4.500)

## 2020-08-29 LAB — VITAMIN B12: Vitamin B-12: 616 pg/mL (ref 232–1245)

## 2020-11-09 LAB — LIPID PANEL
Cholesterol: 164 (ref 0–200)
HDL: 56 (ref 35–70)
LDL Cholesterol: 95
LDl/HDL Ratio: 2.9
Triglycerides: 66 (ref 40–160)

## 2020-11-09 LAB — BASIC METABOLIC PANEL
BUN: 17 (ref 4–21)
CO2: 28 — AB (ref 13–22)
Chloride: 107 (ref 99–108)
Creatinine: 0.9 (ref 0.5–1.1)
Glucose: 85
Potassium: 3.8 (ref 3.4–5.3)
Sodium: 141 (ref 137–147)

## 2020-11-09 LAB — HEPATIC FUNCTION PANEL
ALT: 13 (ref 7–35)
AST: 17 (ref 13–35)
Bilirubin, Direct: 0.2 (ref 0.01–0.4)
Bilirubin, Total: 0.8

## 2020-11-09 LAB — COMPREHENSIVE METABOLIC PANEL
Albumin: 3.9 (ref 3.5–5.0)
Calcium: 9.2 (ref 8.7–10.7)
GFR calc Af Amer: 72
GFR calc non Af Amer: 59

## 2020-11-09 LAB — VITAMIN D 25 HYDROXY (VIT D DEFICIENCY, FRACTURES): Vit D, 25-Hydroxy: 47.8

## 2020-11-26 ENCOUNTER — Other Ambulatory Visit: Payer: Self-pay | Admitting: Cardiology

## 2020-12-12 ENCOUNTER — Other Ambulatory Visit: Payer: Self-pay | Admitting: Cardiology

## 2020-12-25 ENCOUNTER — Encounter: Payer: Self-pay | Admitting: Internal Medicine

## 2020-12-25 ENCOUNTER — Other Ambulatory Visit: Payer: Self-pay

## 2020-12-25 ENCOUNTER — Ambulatory Visit (INDEPENDENT_AMBULATORY_CARE_PROVIDER_SITE_OTHER): Payer: Medicare Other | Admitting: Internal Medicine

## 2020-12-25 VITALS — BP 108/64 | HR 64 | Temp 97.8°F | Ht 66.0 in | Wt 160.6 lb

## 2020-12-25 DIAGNOSIS — I471 Supraventricular tachycardia: Secondary | ICD-10-CM | POA: Diagnosis not present

## 2020-12-25 DIAGNOSIS — M810 Age-related osteoporosis without current pathological fracture: Secondary | ICD-10-CM

## 2020-12-25 DIAGNOSIS — Z79899 Other long term (current) drug therapy: Secondary | ICD-10-CM

## 2020-12-25 DIAGNOSIS — N951 Menopausal and female climacteric states: Secondary | ICD-10-CM

## 2020-12-25 MED ORDER — METOPROLOL TARTRATE 25 MG PO TABS: 12.5000 mg | ORAL_TABLET | Freq: Three times a day (TID) | ORAL | 1 refills | Status: DC | PRN

## 2020-12-25 NOTE — Progress Notes (Signed)
Clarified Metoprolol frequency with Dr. Antoine Poche. Medication list and prescription updated.

## 2020-12-25 NOTE — Progress Notes (Signed)
I,Katawbba Wiggins,acting as a Education administrator for Maximino Greenland, MD.,have documented all relevant documentation on the behalf of Maximino Greenland, MD,as directed by  Maximino Greenland, MD while in the presence of Maximino Greenland, MD.  This visit occurred during the SARS-CoV-2 public health emergency.  Safety protocols were in place, including screening questions prior to the visit, additional usage of staff PPE, and extensive cleaning of exam room while observing appropriate contact time as indicated for disinfecting solutions.  Subjective:     Patient ID: Andrea Mendoza , female    DOB: Nov 02, 1956 , 65 y.o.   MRN: [id number]   Chief Complaint  Patient presents with  . hormones f/u    HPI  She presents today for f/u BHRT. She has been busy with the care of her in-laws .  She reports compliance with her BHRT regimen. She is currently on Biest daily except Sundays, progesterone/DHEA capsules and topical testosterone. She has no new concerns.     Past Medical History:  Diagnosis Date  . Abnormal vaginal Pap smear   . History of fibrocystic disease of breast   . Hx of migraines   . Kidney stones   . Osteopenia   . Post menopausal problems   . Transient hypertension      Family History  Problem Relation Age of Onset  . Heart attack Father   . Heart attack Mother   . Dementia Other   . CVA Other   . CAD Other   . Osteoporosis Other   . Stroke Other        X 2  . Colon polyps Other   . CVA Other   . CAD Other   . Breast cancer Other      Current Outpatient Medications:  .  alendronate (FOSAMAX) 70 MG tablet, 1 tablet 30 minutes before the first food, beverage or medicine of the day with plain water, Disp: , Rfl:  .  atorvastatin (LIPITOR) 40 MG tablet, Take 1 tablet by mouth once daily, Disp: 90 tablet, Rfl: 0 .  Cholecalciferol (VITAMIN D) 50 MCG (2000 UT) tablet, Take 2,000 Units by mouth at bedtime., Disp: , Rfl:  .  Estradiol-Estriol-Progesterone (BIEST/PROGESTERONE) CREA, Place  1 application onto the skin every Monday, Tuesday, Wednesday, Thursday, and Friday. 0.2(0.16-0.04) mg/ml Monday - Saturday, Disp: , Rfl:  .  metoprolol tartrate (LOPRESSOR) 25 MG tablet, Take 0.5 tablets (12.5 mg total) by mouth 3 (three) times daily as needed., Disp: 45 tablet, Rfl: 1 .  Progesterone Micronized (PROGESTERONE PO), Take 1 capsule by mouth every Monday, Tuesday, Wednesday, Thursday, and Friday. 150 mg progesterone 5 mg dhea, Disp: , Rfl:  .  TESTOSTERONE COMPOUNDING KIT TD, Place 1 application onto the skin See admin instructions. 0.02%  0.39m /ml apply topically daily except on sundays  Monday - FRIDAY, Disp: , Rfl:  .  SUPER B COMPLEX/C PO, Take 1 tablet by mouth every evening. (Patient not taking: Reported on 12/25/2020), Disp: , Rfl:    No Known Allergies   Review of Systems  Constitutional: Negative.   Respiratory: Negative.   Cardiovascular: Negative.   Gastrointestinal: Negative.   Neurological: Negative.   Psychiatric/Behavioral: Negative.      Today's Vitals   12/25/20 1429  BP: 108/64  Pulse: 64  Temp: 97.8 F (36.6 C)  TempSrc: Oral  Weight: 160 lb 9.6 oz (72.8 kg)  Height: 5' 6"  (1.676 m)  PainSc: 0-No pain   Body mass index is 25.92 kg/m.  Wt  Readings from Last 3 Encounters:  12/25/20 160 lb 9.6 oz (72.8 kg)  08/28/20 155 lb 14.4 oz (70.7 kg)  04/24/20 155 lb 6.4 oz (70.5 kg)   Objective:  Physical Exam Vitals and nursing note reviewed.  Constitutional:      Appearance: Normal appearance.  HENT:     Head: Normocephalic and atraumatic.     Nose:     Comments: Masked     Mouth/Throat:     Comments: Masked  Cardiovascular:     Rate and Rhythm: Normal rate and regular rhythm.     Heart sounds: Normal heart sounds.  Pulmonary:     Effort: Pulmonary effort is normal.     Breath sounds: Normal breath sounds.  Musculoskeletal:     Cervical back: Normal range of motion.  Skin:    General: Skin is warm.  Neurological:     General: No focal  deficit present.     Mental Status: She is alert.  Psychiatric:        Mood and Affect: Mood normal.        Behavior: Behavior normal.         Assessment And Plan:     1. Female climacteric state Comments: Chronic. She has been on Bhrt for greater than 5 years, currently on much lower dose than she has been on in the past. Plan to further titrate the BI-est further at her next visit. LFTS recently drawn by her PCP, will check testosterone level today. She will f/u in 4 months.   2. AVNRT (AV nodal re-entry tachycardia) ([place]) Comments: Chronic, also followed by Cardiology. She is encouraged to take meds as prescribed, stay hydrated and c/w magnesium supplementation.   3. Menopausal osteoporosis Comments: She is being managed by PCP, considering bisphosphonate therapy. She is considering Rheum evaluation as well, will consider Prolia. She is reminded of importance of weight-bearing exercises,calcium/vitamin D supplementation.   4. Drug therapy - Testosterone, Total     Patient was given opportunity to ask questions. Patient verbalized understanding of the plan and was able to repeat key elements of the plan. All questions were answered to their satisfaction.   I, Maximino Greenland, MD, have reviewed all documentation for this visit. The documentation on 01/31/21 for the exam, diagnosis, procedures, and orders are all accurate and complete.  THE PATIENT IS ENCOURAGED TO PRACTICE SOCIAL DISTANCING DUE TO THE COVID-19 PANDEMIC.

## 2020-12-26 LAB — TESTOSTERONE: Testosterone: 7 ng/dL (ref 3–67)

## 2021-01-01 ENCOUNTER — Encounter: Payer: Self-pay | Admitting: Internal Medicine

## 2021-01-03 ENCOUNTER — Other Ambulatory Visit: Payer: Self-pay | Admitting: Family Medicine

## 2021-01-03 DIAGNOSIS — R319 Hematuria, unspecified: Secondary | ICD-10-CM

## 2021-01-03 DIAGNOSIS — M5440 Lumbago with sciatica, unspecified side: Secondary | ICD-10-CM

## 2021-01-04 ENCOUNTER — Telehealth: Payer: Self-pay

## 2021-01-04 NOTE — Telephone Encounter (Signed)
The patient agrees to have estrogen decreased in her compound. Prescription called in  Biest 0.2mg /ml 0.25cc topically daily except Sunday, 90 day supply, the pt was told that she needed a f/u in 3 mths and a partial saliva test done.  The pt said that would be great and her appt is already scheduled for June.

## 2021-01-14 ENCOUNTER — Ambulatory Visit
Admission: RE | Admit: 2021-01-14 | Discharge: 2021-01-14 | Disposition: A | Discharge status: Home / Self Care | Payer: Medicare Other | Source: Ambulatory Visit | Attending: Family Medicine | Admitting: Family Medicine

## 2021-01-14 DIAGNOSIS — R319 Hematuria, unspecified: Secondary | ICD-10-CM

## 2021-01-14 DIAGNOSIS — M5440 Lumbago with sciatica, unspecified side: Secondary | ICD-10-CM

## 2021-01-29 NOTE — Progress Notes (Incomplete)
I,Katawbba Wiggins,acting as a Education administrator for Maximino Greenland, MD.,have documented all relevant documentation on the behalf of Maximino Greenland, MD,as directed by  Maximino Greenland, MD while in the presence of Maximino Greenland, MD.  This visit occurred during the SARS-CoV-2 public health emergency.  Safety protocols were in place, including screening questions prior to the visit, additional usage of staff PPE, and extensive cleaning of exam room while observing appropriate contact time as indicated for disinfecting solutions.  Subjective:     Patient ID: Andrea Mendoza , female    DOB: [birth year] , 65 y.o.   MRN: [id number]   Chief Complaint  Patient presents with  . hormones f/u    HPI  She presents today for f/u BHRT. She has been busy with the care of her in-laws.  She reports compliance with her BHRT regimen, Biest, progesterone/DHEA and testosterone. She is not experiencing any hot flashes.     Past Medical History:  Diagnosis Date  . Abnormal vaginal Pap smear   . History of fibrocystic disease of breast   . Hx of migraines   . Kidney stones   . Osteopenia   . Post menopausal problems   . Transient hypertension      Family History  Problem Relation Age of Onset  . Heart attack Father   . Heart attack Mother   . Dementia Other   . CVA Other   . CAD Other   . Osteoporosis Other   . Stroke Other        X 2  . Colon polyps Other   . CVA Other   . CAD Other   . Breast cancer Other      Current Outpatient Medications:  .  alendronate (FOSAMAX) 70 MG tablet, 1 tablet 30 minutes before the first food, beverage or medicine of the day with plain water, Disp: , Rfl:  .  atorvastatin (LIPITOR) 40 MG tablet, Take 1 tablet by mouth once daily, Disp: 90 tablet, Rfl: 0 .  Cholecalciferol (VITAMIN D) 50 MCG (2000 UT) tablet, Take 2,000 Units by mouth at bedtime., Disp: , Rfl:  .  Estradiol-Estriol-Progesterone (BIEST/PROGESTERONE) CREA, Place 1 application onto the skin every Monday,  Tuesday, Wednesday, Thursday, and Friday. 0.2(0.16-0.04) mg/ml Monday - Saturday, Disp: , Rfl:  .  metoprolol tartrate (LOPRESSOR) 25 MG tablet, Take 0.5 tablets (12.5 mg total) by mouth 3 (three) times daily as needed., Disp: 45 tablet, Rfl: 1 .  Progesterone Micronized (PROGESTERONE PO), Take 1 capsule by mouth every Monday, Tuesday, Wednesday, Thursday, and Friday. 150 mg progesterone 5 mg dhea, Disp: , Rfl:  .  TESTOSTERONE COMPOUNDING KIT TD, Place 1 application onto the skin See admin instructions. 0.02%  0.31m /ml apply topically daily except on sundays  Monday - FRIDAY, Disp: , Rfl:  .  SUPER B COMPLEX/C PO, Take 1 tablet by mouth every evening. (Patient not taking: Reported on 12/25/2020), Disp: , Rfl:    No Known Allergies   Review of Systems  Constitutional: Negative.   Respiratory: Negative.   Cardiovascular: Negative.   Gastrointestinal: Negative.   Neurological: Negative.   Psychiatric/Behavioral: Negative.      Today's Vitals   12/25/20 1429  BP: 108/64  Pulse: 64  Temp: 97.8 F (36.6 C)  TempSrc: Oral  Weight: 160 lb 9.6 oz (72.8 kg)  Height: 5' 6" (1.676 m)  PainSc: 0-No pain   Body mass index is 25.92 kg/m.  Wt Readings from Last 3 Encounters:  12/25/20 160  lb 9.6 oz (72.8 kg)  08/28/20 155 lb 14.4 oz (70.7 kg)  04/24/20 155 lb 6.4 oz (70.5 kg)   Objective:  Physical Exam Vitals and nursing note reviewed.  Constitutional:      Appearance: Normal appearance.  HENT:     Head: Normocephalic and atraumatic.     Nose:     Comments: Masked     Mouth/Throat:     Comments: Masked  Cardiovascular:     Rate and Rhythm: Normal rate and regular rhythm.     Heart sounds: Normal heart sounds.  Pulmonary:     Effort: Pulmonary effort is normal.     Breath sounds: Normal breath sounds.  Musculoskeletal:     Cervical back: Normal range of motion.  Skin:    General: Skin is warm.  Neurological:     General: No focal deficit present.     Mental Status: She is  alert.  Psychiatric:        Mood and Affect: Mood normal.        Behavior: Behavior normal.         Assessment And Plan:     1. Female climacteric state Comments: Chronic. She has been on Bhrt for greater than 5 years, currently on much lower dose than she has been on in the past. She is willing  2. AVNRT (AV nodal re-entry tachycardia) (HCC) Comments: Chronic, also followed by Cardiology. She is encouraged to take meds as prescribed, stay hydrated and c/w magnesium supplementation.   3. Menopausal osteoporosis Comments: She is being managed by PCP, considering bisphosphonate therapy. She is considering Rheum evaluation as well, will consider Prolia.   4. Drug therapy - Testosterone, Total     Patient was given opportunity to ask questions. Patient verbalized understanding of the plan and was able to repeat key elements of the plan. All questions were answered to their satisfaction.    I, Robyn N Sanders, MD, have reviewed all documentation for this visit. The documentation on 12/25/20 for the exam, diagnosis, procedures, and orders are all accurate and complete.  THE PATIENT IS ENCOURAGED TO PRACTICE SOCIAL DISTANCING DUE TO THE COVID-19 PANDEMIC.   

## 2021-02-04 ENCOUNTER — Other Ambulatory Visit: Payer: Self-pay | Admitting: Family Medicine

## 2021-02-04 DIAGNOSIS — Z1231 Encounter for screening mammogram for malignant neoplasm of breast: Secondary | ICD-10-CM

## 2021-02-20 ENCOUNTER — Other Ambulatory Visit: Payer: Self-pay | Admitting: Cardiology

## 2021-04-08 ENCOUNTER — Ambulatory Visit
Admission: RE | Admit: 2021-04-08 | Discharge: 2021-04-08 | Disposition: A | Discharge status: Home / Self Care | Payer: Medicare Other | Source: Ambulatory Visit | Attending: Family Medicine | Admitting: Family Medicine

## 2021-04-08 ENCOUNTER — Other Ambulatory Visit: Payer: Self-pay

## 2021-04-08 DIAGNOSIS — Z1231 Encounter for screening mammogram for malignant neoplasm of breast: Secondary | ICD-10-CM

## 2021-04-14 NOTE — Progress Notes (Signed)
Cardiology Office Note   Date:  04/15/2021   ID:  AYLENE ACOFF, DOB 1956/08/01, MRN [id number]  PCP:  Orpah Melter, MD  Cardiologist:   Minus Breeding, MD   Chief Complaint  Patient presents with   Elevated coronary calcium score     History of Present Illness: Andrea Mendoza is a 65 y.o. female who presents for evaluation of SVT and coronary calcium.  She had a negative POET (Plain Old Exercise Treadmill) in July 2021.  Since I last saw her she has had problems caring for her elderly parents.  Now her mother-in-law is only 1 living but they are still spending a lot of time getting back and forth to [place] with him.  She is not very active when they are doing this.  However, when she cannot be active such as she was walking on the beach for 5 miles at a time recently she has felt fine.The patient denies any new symptoms such as chest discomfort, neck or arm discomfort. There has been no new ND or orthopnea. There have been no reported palpitations, presyncope or syncope.   She might get some DOE with stress or significant exertion.    Past Medical History:  Diagnosis Date   Abnormal vaginal Pap smear    History of fibrocystic disease of breast    Hx of migraines    Kidney stones    Osteopenia    Post menopausal problems    Transient hypertension     Past Surgical History:  Procedure Laterality Date   BREAST BIOPSY Right    SVT ABLATION N/A 01/05/2019   Procedure: SVT ABLATION;  Surgeon: Constance Haw, MD;  Location: [facility];  Service: Cardiovascular;  Laterality: N/A;   VAGINAL HYSTERECTOMY       Current Outpatient Medications  Medication Sig Dispense Refill   alendronate (FOSAMAX) 70 MG tablet 1 tablet 30 minutes before the first food, beverage or medicine of the day with plain water     aspirin EC 81 MG tablet Take 1 tablet (81 mg total) by mouth daily. Swallow whole. 90 tablet 3   Cholecalciferol (VITAMIN D) 50 MCG (2000 UT) tablet Take  2,000 Units by mouth at bedtime.     Estradiol-Estriol-Progesterone (BIEST/PROGESTERONE) CREA Place 1 application onto the skin every Monday, Tuesday, Wednesday, Thursday, and Friday. 0.2(0.16-0.04) mg/ml Monday - Saturday     metoprolol tartrate (LOPRESSOR) 25 MG tablet Take 0.5 tablets (12.5 mg total) by mouth 3 (three) times daily as needed. 45 tablet 1   Progesterone Micronized (PROGESTERONE PO) Take 1 capsule by mouth every Monday, Tuesday, Wednesday, Thursday, and Friday. 150 mg progesterone 5 mg dhea     rosuvastatin (CRESTOR) 40 MG tablet Take 1 tablet (40 mg total) by mouth daily. 90 tablet 3   TESTOSTERONE COMPOUNDING KIT TD Place 1 application onto the skin See admin instructions. 0.02%  0.55m /ml apply topically daily except on sundays  Monday - FRIDAY     No current facility-administered medications for this visit.    Allergies:   Patient has no known allergies.     ROS:  Please see the history of present illness.   Otherwise, review of systems are positive for none.   All other systems are reviewed and negative.    PHYSICAL EXAM: VS:  BP 120/82 (BP Location: Left Arm, Patient Position: Sitting, Cuff Size: Normal)   Pulse (!) 53   Ht 5' 6"  (1.676 m)   Wt 160  lb 3.2 oz (72.7 kg)   BMI 25.86 kg/m  , BMI Body mass index is 25.86 kg/m. GENERAL:  Well appearing NECK:  No jugular venous distention, waveform within normal limits, carotid upstroke brisk and symmetric, no bruits, no thyromegaly LUNGS:  Clear to auscultation bilaterally CHEST:  Unremarkable HEART:  PMI not displaced or sustained,S1 and S2 within normal limits, no S3, no S4, no clicks, no rubs, no murmurs ABD:  Flat, positive bowel sounds normal in frequency in pitch, no bruits, no rebound, no guarding, no midline pulsatile mass, no hepatomegaly, no splenomegaly EXT:  2 plus pulses throughout, no edema, no cyanosis no clubbing   EKG:  EKG is  ordered today. The ekg ordered today demonstrates sinus rhythm, rate  53, axis normal limits, intervals within normal limits, no acute ST-T wave changes.   Recent Labs: 04/24/2020: Hemoglobin 13.4; Platelets 270 08/28/2020: TSH 2.320 11/09/2020: ALT 13; BUN 17; Creatinine 0.9; Potassium 3.8; Sodium 141    Lipid Panel    Component Value Date/Time   CHOL 164 11/09/2020 0000   TRIG 66 11/09/2020 0000   HDL 56 11/09/2020 0000   LDLCALC 95 11/09/2020 0000      Wt Readings from Last 3 Encounters:  04/15/21 160 lb 3.2 oz (72.7 kg)  12/25/20 160 lb 9.6 oz (72.8 kg)  08/28/20 155 lb 14.4 oz (70.7 kg)      Other studies Reviewed: Additional studies/ records that were reviewed today include: Labs. Review of the above records demonstrates:  Please see elsewhere in the note.     ASSESSMENT AND PLAN:  AVNRT:   She has no problems with this.  No change in therapy.  This was successfully ablated.   DYSLIPIDEMIA:   LDL is 95 earlier this year.  I am going to stop her Lipitor and start Crestor 40 mg daily.  We talked about plant-based diet.  We will repeat a lipid profile in 3 months.   ELEVATED CORONARY CALCIUM SCORE:     She has a significantly elevated calcium score.  She has some DOE.   I would like to bring her back to screen her with a POET (Plain Old Exercise Treadmill).      Of note her MESA score is 11.3 and we discussed the risk benefits of aspirin and she will start 81 mg.  HTN:    Her blood pressure was at target.  No change in therapy.  Current medicines are reviewed at length with the patient today.  The patient does not have concerns regarding medicines.  The following changes have been made: As above  Labs/ tests ordered today include:   Orders Placed This Encounter  Procedures   Lipoprotein A (LPA)   Lipid panel   Hepatic function panel   EXERCISE TOLERANCE TEST (ETT)   EKG 12-Lead      Disposition:   FU with in 12 months.     Signed, Minus Breeding, MD  04/15/2021 10:14 AM    [place]

## 2021-04-15 ENCOUNTER — Encounter: Payer: Self-pay | Admitting: Cardiology

## 2021-04-15 ENCOUNTER — Ambulatory Visit (INDEPENDENT_AMBULATORY_CARE_PROVIDER_SITE_OTHER): Payer: Medicare Other | Admitting: Cardiology

## 2021-04-15 ENCOUNTER — Other Ambulatory Visit: Payer: Self-pay

## 2021-04-15 VITALS — BP 120/82 | HR 53 | Ht 66.0 in | Wt 160.2 lb

## 2021-04-15 DIAGNOSIS — I471 Supraventricular tachycardia: Secondary | ICD-10-CM | POA: Diagnosis not present

## 2021-04-15 DIAGNOSIS — R931 Abnormal findings on diagnostic imaging of heart and coronary circulation: Secondary | ICD-10-CM | POA: Diagnosis not present

## 2021-04-15 DIAGNOSIS — I10 Essential (primary) hypertension: Secondary | ICD-10-CM

## 2021-04-15 DIAGNOSIS — E785 Hyperlipidemia, unspecified: Secondary | ICD-10-CM

## 2021-04-15 DIAGNOSIS — R0602 Shortness of breath: Secondary | ICD-10-CM | POA: Diagnosis not present

## 2021-04-15 MED ORDER — ASPIRIN EC 81 MG PO TBEC: 81.0000 mg | DELAYED_RELEASE_TABLET | Freq: Every day | ORAL | 3 refills | Status: DC

## 2021-04-15 MED ORDER — ROSUVASTATIN CALCIUM 40 MG PO TABS: 40.0000 mg | ORAL_TABLET | Freq: Every day | ORAL | 3 refills | Status: DC

## 2021-04-15 NOTE — Addendum Note (Signed)
Addended by: Darene Lamer T on: 04/15/2021 10:15 AM   Modules accepted: Orders

## 2021-04-15 NOTE — Patient Instructions (Signed)
Medication Instructions:  STOP Lipitor START Crestor 40 mg daily  Be sure to take 81 mg baby Aspirin (over the counter)  *If you need a refill on your cardiac medications before your next appointment, please call your pharmacy*   Lab Work: LIPID, LIVER, LPa (3 months, come fasting- nothing to eat or drink, no lab appointment needed)  If you have labs (blood work) drawn today and your tests are completely normal, you will receive your results only by: MyChart Message (if you have MyChart) OR A paper copy in the mail If you have any lab test that is abnormal or we need to change your treatment, we will call you to review the results.   Testing/Procedures:  In July- Your physician has requested that you have an exercise tolerance test, this is a screening tool to track your fitness level. This test evaluates the your exercise capacity by measuring cardiovascular response to exercise, the stress response is induced by exercise (exercise-treadmill).  Graded exercise test is also known as maximal exercise test or stress EKG test  . Please also follow instruction sheet given.   Follow-Up: At [facility], you and your health needs are our priority.  As part of our continuing mission to provide you with exceptional heart care, we have created designated Provider Care Teams.  These Care Teams include your primary Cardiologist (physician) and Advanced Practice Providers (APPs -  Physician Assistants and Nurse Practitioners) who all work together to provide you with the care you need, when you need it.  We recommend signing up for the patient portal called "MyChart".  Sign up information is provided on this After Visit Summary.  MyChart is used to connect with patients for Virtual Visits (Telemedicine).  Patients are able to view lab/test results, encounter notes, upcoming appointments, etc.  Non-urgent messages can be sent to your provider as well.   To learn more about what you can do with  MyChart, go to [email or web address].    Your next appointment:   12 month(s)  The format for your next appointment:   In Person  Provider:   Rollene Rotunda, MD

## 2021-04-22 NOTE — Addendum Note (Signed)
Addended by: Rollene Rotunda on: 04/22/2021 07:17 AM   Modules accepted: Orders

## 2021-04-24 ENCOUNTER — Other Ambulatory Visit: Payer: Self-pay

## 2021-04-24 ENCOUNTER — Encounter: Payer: Self-pay | Admitting: Internal Medicine

## 2021-04-24 ENCOUNTER — Ambulatory Visit (INDEPENDENT_AMBULATORY_CARE_PROVIDER_SITE_OTHER): Payer: Medicare Other | Admitting: Internal Medicine

## 2021-04-24 VITALS — BP 122/80 | HR 57 | Temp 98.8°F | Ht 66.0 in | Wt 158.8 lb

## 2021-04-24 DIAGNOSIS — R931 Abnormal findings on diagnostic imaging of heart and coronary circulation: Secondary | ICD-10-CM | POA: Diagnosis not present

## 2021-04-24 DIAGNOSIS — M79644 Pain in right finger(s): Secondary | ICD-10-CM

## 2021-04-24 DIAGNOSIS — Z79899 Other long term (current) drug therapy: Secondary | ICD-10-CM

## 2021-04-24 DIAGNOSIS — N951 Menopausal and female climacteric states: Secondary | ICD-10-CM

## 2021-04-24 DIAGNOSIS — M810 Age-related osteoporosis without current pathological fracture: Secondary | ICD-10-CM | POA: Diagnosis not present

## 2021-04-24 NOTE — Progress Notes (Signed)
I,Katawbba Wiggins,acting as a Education administrator for Maximino Greenland, MD.,have documented all relevant documentation on the behalf of Maximino Greenland, MD,as directed by  Maximino Greenland, MD while in the presence of Maximino Greenland, MD.  This visit occurred during the SARS-CoV-2 public health emergency.  Safety protocols were in place, including screening questions prior to the visit, additional usage of staff PPE, and extensive cleaning of exam room while observing appropriate contact time as indicated for disinfecting solutions.  Subjective:     Patient ID: Andrea Mendoza , female    DOB: [birth year] , 65 y.o.   MRN: [id number]   Chief Complaint  Patient presents with   Hormones f/u    HPI  She presents today for f/u BHRT.  Unfortunately, she has been having hot flashes and night sweats for the past 1-2 weeks. She is not sure what may have triggered her sx. She denies change in her eating/sleeping habits. She is still on the hwy frequently to care for family in [place].     Past Medical History:  Diagnosis Date   Abnormal vaginal Pap smear    History of fibrocystic disease of breast    Hx of migraines    Kidney stones    Osteopenia    Post menopausal problems    Transient hypertension      Family History  Problem Relation Age of Onset   Heart attack Father    Heart attack Mother    Dementia Other    CVA Other    CAD Other    Osteoporosis Other    Stroke Other        X 2   Colon polyps Other    CVA Other    CAD Other    Breast cancer Other      Current Outpatient Medications:    alendronate (FOSAMAX) 70 MG tablet, 1 tablet 30 minutes before the first food, beverage or medicine of the day with plain water, Disp: , Rfl:    aspirin EC 81 MG tablet, Take 1 tablet (81 mg total) by mouth daily. Swallow whole., Disp: 90 tablet, Rfl: 3   Cholecalciferol (VITAMIN D) 50 MCG (2000 UT) tablet, Take 2,000 Units by mouth at bedtime., Disp: , Rfl:    Estradiol-Estriol-Progesterone  (BIEST/PROGESTERONE) CREA, Place 1 application onto the skin every Monday, Tuesday, Wednesday, Thursday, and Friday. 0.2(0.16-0.04) mg/ml Monday - Saturday, Disp: , Rfl:    Progesterone Micronized (PROGESTERONE PO), Take 1 capsule by mouth every Monday, Tuesday, Wednesday, Thursday, and Friday. 150 mg progesterone 5 mg dhea  Monday - SATURDAY, Disp: , Rfl:    rosuvastatin (CRESTOR) 40 MG tablet, Take 1 tablet (40 mg total) by mouth daily., Disp: 90 tablet, Rfl: 3   TESTOSTERONE COMPOUNDING KIT TD, Place 1 application onto the skin See admin instructions. 0.02%  0.67m /ml apply topically daily except on sundays  Monday - FRIDAY, Disp: , Rfl:    metoprolol tartrate (LOPRESSOR) 25 MG tablet, TAKE 1/2 (ONE-HALF) TABLET BY MOUTH AS NEEDED, Disp: 45 tablet, Rfl: 0   No Known Allergies   Review of Systems  Constitutional: Negative.   Respiratory: Negative.    Cardiovascular: Negative.   Gastrointestinal: Negative.   Musculoskeletal:  Positive for arthralgias.       She c/o r finger pain. Ring finger. Denies fall/trauma. Not sure what may be triggering her sx. There is some swelling as well.   Psychiatric/Behavioral: Negative.    All other systems reviewed and are negative.  Today's Vitals   04/24/21 1425  BP: 122/80  Pulse: (!) 57  Temp: 98.8 F (37.1 C)  Weight: 158 lb 12.8 oz (72 kg)  Height: _0  (1.676 m)  PainSc: 0-No pain   Body mass index is 25.63 kg/m.  Wt Readings from Last 3 Encounters:  04/24/21 158 lb 12.8 oz (72 kg)  04/15/21 160 lb 3.2 oz (72.7 kg)  12/25/20 160 lb 9.6 oz (72.8 kg)    BP Readings from Last 3 Encounters:  04/24/21 122/80  04/15/21 120/82  12/25/20 108/64    Objective:  Physical Exam Vitals and nursing note reviewed.  Constitutional:      Appearance: Normal appearance.  HENT:     Head: Normocephalic and atraumatic.     Nose:     Comments: Masked     Mouth/Throat:     Comments: Masked  Cardiovascular:     Rate and Rhythm: Normal rate and  regular rhythm.     Heart sounds: Normal heart sounds.  Pulmonary:     Effort: Pulmonary effort is normal.     Breath sounds: Normal breath sounds.  Musculoskeletal:        General: Swelling and tenderness present. No signs of injury.     Cervical back: Normal range of motion.  Skin:    General: Skin is warm.  Neurological:     General: No focal deficit present.     Mental Status: She is alert.  Psychiatric:        Mood and Affect: Mood normal.        Behavior: Behavior normal.        Assessment And Plan:     1. Female climacteric state Comments: She will c/w current meds, will ask CC to change syringe, so when we decrease to 1/4cc it will be easier for her to dispense correct amount.  - Testosterone, Total  2. Osteoporosis of femur without pathological fracture Comments: Currently on Fosamax, managed by PCP. Encouraged to comply with calcium/vitamin D supplementation.   3. Finger pain, right Comments: I will check arthritis panel.  She is encouraged to follow anti-inflammatory diet. If panel is positive, I will refer her to Rheum for further evaluation. Advised to apply Voltaren gel to affected area bid-tid prn. Will consider Hand referral if her sx persist.  - ANA, IFA (with reflex) - Rheumatoid factor - CBC no Diff - Liver Profile - CYCLIC CITRUL PEPTIDE ANTIBODY, IGG/IGA - Uric acid - Sedimentation rate  4. Drug therapy - CBC no Diff - Liver Profile    Patient was given opportunity to ask questions. Patient verbalized understanding of the plan and was able to repeat key elements of the plan. All questions were answered to their satisfaction.   I, Maximino Greenland, MD, have reviewed all documentation for this visit. The documentation on 05/23/21 for the exam, diagnosis, procedures, and orders are all accurate and complete.   IF YOU HAVE BEEN REFERRED TO A SPECIALIST, IT MAY TAKE 1-2 WEEKS TO SCHEDULE/PROCESS THE REFERRAL. IF YOU HAVE NOT HEARD FROM US/SPECIALIST IN  TWO WEEKS, PLEASE GIVE [place] A CALL AT [phone].   THE PATIENT IS ENCOURAGED TO PRACTICE SOCIAL DISTANCING DUE TO THE COVID-19 PANDEMIC.

## 2021-05-03 ENCOUNTER — Encounter: Payer: Self-pay | Admitting: Internal Medicine

## 2021-05-03 LAB — ANTINUCLEAR ANTIBODIES, IFA: ANA Titer 1: NEGATIVE

## 2021-05-03 LAB — CBC
Hematocrit: 39.6 % (ref 34.0–46.6)
Hemoglobin: 13.6 g/dL (ref 11.1–15.9)
MCH: 31.1 pg (ref 26.6–33.0)
MCHC: 34.3 g/dL (ref 31.5–35.7)
MCV: 90 fL (ref 79–97)
Platelets: 255 10*3/uL (ref 150–450)
RBC: 4.38 x10E6/uL (ref 3.77–5.28)
RDW: 12.7 % (ref 11.7–15.4)
WBC: 6.7 10*3/uL (ref 3.4–10.8)

## 2021-05-03 LAB — HEPATIC FUNCTION PANEL
ALT: 14 IU/L (ref 0–32)
AST: 23 IU/L (ref 0–40)
Albumin: 4.3 g/dL (ref 3.8–4.8)
Alkaline Phosphatase: 68 IU/L (ref 44–121)
Bilirubin Total: 0.5 mg/dL (ref 0.0–1.2)
Bilirubin, Direct: 0.17 mg/dL (ref 0.00–0.40)
Total Protein: 6.8 g/dL (ref 6.0–8.5)

## 2021-05-03 LAB — RHEUMATOID FACTOR: Rheumatoid fact SerPl-aCnc: <10 IU/mL (ref <14.0)

## 2021-05-03 LAB — TESTOSTERONE: Testosterone: 8 ng/dL (ref 3–67)

## 2021-05-03 LAB — URIC ACID: Uric Acid: 4.3 mg/dL (ref 3.0–7.2)

## 2021-05-03 LAB — CYCLIC CITRUL PEPTIDE ANTIBODY, IGG/IGA: Cyclic Citrullin Peptide Ab: 7 units (ref 0–19)

## 2021-05-03 LAB — SEDIMENTATION RATE: Sed Rate: 8 mm/hr (ref 0–40)

## 2021-05-09 ENCOUNTER — Other Ambulatory Visit: Payer: Self-pay | Admitting: Cardiology

## 2021-05-24 ENCOUNTER — Ambulatory Visit (HOSPITAL_COMMUNITY)
Admission: RE | Admit: 2021-05-24 | Payer: Medicare Other | Source: Ambulatory Visit | Attending: Cardiology | Admitting: Cardiology

## 2021-05-27 ENCOUNTER — Telehealth (HOSPITAL_COMMUNITY): Payer: Self-pay | Admitting: Cardiology

## 2021-05-27 NOTE — Telephone Encounter (Signed)
Patient called and cancelled GXT due to a conflict with her schedule. Patient states that she will call back at a later date to reschedule. Order will be removed from the echo/GXT Wq and when patient calls back we will reinstate the order. Thank you.

## 2021-05-27 NOTE — Telephone Encounter (Signed)
Noted thank you

## 2021-07-31 ENCOUNTER — Encounter: Payer: Self-pay | Admitting: *Deleted

## 2021-08-26 ENCOUNTER — Telehealth: Payer: Self-pay | Admitting: Internal Medicine

## 2021-08-26 NOTE — Telephone Encounter (Signed)
Confirm with patient dr Allyne Gee is PCP.  She see dr Allyne Gee for [facility]  Left message for patient to call back and schedule Medicare Annual Wellness Visit (AWV) either virtually or in office.  Left both my jabber number [phone]   awvs 09/03/21 per [facility]  please schedule at anytime with [facility]    This should be a 45 minute visit.

## 2021-08-29 ENCOUNTER — Ambulatory Visit: Payer: Medicare Other | Admitting: Internal Medicine

## 2021-09-03 ENCOUNTER — Ambulatory Visit (INDEPENDENT_AMBULATORY_CARE_PROVIDER_SITE_OTHER): Payer: Medicare Other | Admitting: Internal Medicine

## 2021-09-03 ENCOUNTER — Encounter: Payer: Self-pay | Admitting: Internal Medicine

## 2021-09-03 ENCOUNTER — Other Ambulatory Visit: Payer: Self-pay

## 2021-09-03 VITALS — BP 112/68 | HR 80 | Temp 98.9°F | Ht 66.0 in | Wt 161.4 lb

## 2021-09-03 DIAGNOSIS — R931 Abnormal findings on diagnostic imaging of heart and coronary circulation: Secondary | ICD-10-CM | POA: Diagnosis not present

## 2021-09-03 DIAGNOSIS — Z8616 Personal history of COVID-19: Secondary | ICD-10-CM | POA: Diagnosis not present

## 2021-09-03 DIAGNOSIS — Z79899 Other long term (current) drug therapy: Secondary | ICD-10-CM

## 2021-09-03 DIAGNOSIS — N951 Menopausal and female climacteric states: Secondary | ICD-10-CM

## 2021-09-03 NOTE — Progress Notes (Signed)
I,Katawbba Wiggins,acting as a Education administrator for Maximino Greenland, MD.,have documented all relevant documentation on the behalf of Maximino Greenland, MD,as directed by  Maximino Greenland, MD while in the presence of Maximino Greenland, MD.  This visit occurred during the SARS-CoV-2 public health emergency.  Safety protocols were in place, including screening questions prior to the visit, additional usage of staff PPE, and extensive cleaning of exam room while observing appropriate contact time as indicated for disinfecting solutions.  Subjective:     Patient ID: Andrea Mendoza , female    DOB: [birth year] , 65 y.o.   MRN: [id number]   Chief Complaint  Patient presents with   hormones follow-up    HPI  She presents today for f/u BHRT. She feels well on her current regimen. She still has some stress in her life, but not as much. Plans to move to [place] in the near future.     Past Medical History:  Diagnosis Date   Abnormal vaginal Pap smear    History of fibrocystic disease of breast    Hx of migraines    Kidney stones    Osteopenia    Post menopausal problems    Transient hypertension      Family History  Problem Relation Age of Onset   Heart attack Father    Heart attack Mother    Dementia Other    CVA Other    CAD Other    Osteoporosis Other    Stroke Other        X 2   Colon polyps Other    CVA Other    CAD Other    Breast cancer Other      Current Outpatient Medications:    Estradiol-Estriol-Progesterone (BIEST/PROGESTERONE) CREA, Place 1 application onto the skin every Monday, Tuesday, Wednesday, Thursday, and Friday. 0.2(0.16-0.04) mg/ml Monday - Saturday, Disp: , Rfl:    metoprolol tartrate (LOPRESSOR) 25 MG tablet, TAKE 1/2 (ONE-HALF) TABLET BY MOUTH AS NEEDED, Disp: 45 tablet, Rfl: 0   Progesterone Micronized (PROGESTERONE PO), Take 1 capsule by mouth. 150 mg progesterone Monday - Friday  and 5 mg dhea, Disp: , Rfl:    rosuvastatin (CRESTOR) 40 MG tablet, Take 1 tablet (40 mg  total) by mouth daily., Disp: 90 tablet, Rfl: 3   TESTOSTERONE COMPOUNDING KIT TD, Place 1 application onto the skin See admin instructions. 0.02%  0.18m /ml apply topically daily except on sundays  Monday - FRIDAY, Disp: , Rfl:    alendronate (FOSAMAX) 70 MG tablet, 1 tablet 30 minutes before the first food, beverage or medicine of the day with plain water (Patient not taking: Reported on 09/03/2021), Disp: , Rfl:    Cholecalciferol (VITAMIN D) 50 MCG (2000 UT) tablet, Take 2,000 Units by mouth at bedtime., Disp: , Rfl:    ezetimibe (ZETIA) 10 MG tablet, Take 1 tablet (10 mg total) by mouth daily., Disp: 90 tablet, Rfl: 3   No Known Allergies   Review of Systems  Constitutional: Negative.   Respiratory: Negative.         She reports having COVID since her last visit. She is fully vaccinated. Feels she has recovered completely.   Cardiovascular: Negative.   Gastrointestinal: Negative.   Psychiatric/Behavioral: Negative.    All other systems reviewed and are negative.   Today's Vitals   09/03/21 1503  BP: 112/68  Pulse: 80  Temp: 98.9 F (37.2 C)  Weight: 161 lb 6.4 oz (73.2 kg)  Height: 5' 6" (1.676 m)  PainSc: 0-No pain   Body mass index is 26.05 kg/m.  Wt Readings from Last 3 Encounters:  09/03/21 161 lb 6.4 oz (73.2 kg)  04/24/21 158 lb 12.8 oz (72 kg)  04/15/21 160 lb 3.2 oz (72.7 kg)    BP Readings from Last 3 Encounters:  09/03/21 112/68  04/24/21 122/80  04/15/21 120/82    Objective:  Physical Exam Vitals and nursing note reviewed.  Constitutional:      Appearance: Normal appearance.  HENT:     Head: Normocephalic and atraumatic.     Nose:     Comments: Masked     Mouth/Throat:     Comments: Masked  Eyes:     Extraocular Movements: Extraocular movements intact.  Cardiovascular:     Rate and Rhythm: Normal rate and regular rhythm.     Heart sounds: Normal heart sounds.  Pulmonary:     Effort: Pulmonary effort is normal.     Breath sounds: Normal breath  sounds.  Musculoskeletal:     Cervical back: Normal range of motion.  Skin:    General: Skin is warm.  Neurological:     General: No focal deficit present.     Mental Status: She is alert.  Psychiatric:        Mood and Affect: Mood normal.        Behavior: Behavior normal.        Assessment And Plan:     1. Female climacteric state Comments: She will c/w her current BHRT regimen. Advised to keep regular GYN appts, perform monthly SBE and get annual mammograms. F/u in 4 months.   2. Personal history of COVID-19 Comments: She is fully vaccinated and boosted x 2.   3. Drug therapy Comments: I will check labs as listed below.  - Estradiol - Progesterone - Testosterone, Total    Patient was given opportunity to ask questions. Patient verbalized understanding of the plan and was able to repeat key elements of the plan. All questions were answered to their satisfaction.   I, Robyn N Sanders, MD, have reviewed all documentation for this visit. The documentation on 09/03/21 for the exam, diagnosis, procedures, and orders are all accurate and complete.   IF YOU HAVE BEEN REFERRED TO A SPECIALIST, IT MAY TAKE 1-2 WEEKS TO SCHEDULE/PROCESS THE REFERRAL. IF YOU HAVE NOT HEARD FROM US/SPECIALIST IN TWO WEEKS, PLEASE GIVE US A CALL AT [phone] X [phone].   THE PATIENT IS ENCOURAGED TO PRACTICE SOCIAL DISTANCING DUE TO THE COVID-19 PANDEMIC.    

## 2021-09-04 LAB — HEPATIC FUNCTION PANEL
ALT: 16 IU/L (ref 0–32)
AST: 21 IU/L (ref 0–40)
Albumin: 4.2 g/dL (ref 3.8–4.8)
Alkaline Phosphatase: 60 IU/L (ref 44–121)
Bilirubin Total: 0.5 mg/dL (ref 0.0–1.2)
Bilirubin, Direct: 0.15 mg/dL (ref 0.00–0.40)
Total Protein: 6.6 g/dL (ref 6.0–8.5)

## 2021-09-04 LAB — ESTRADIOL: Estradiol: <5 pg/mL

## 2021-09-04 LAB — LIPID PANEL
Chol/HDL Ratio: 3.2 ratio (ref 0.0–4.4)
Cholesterol, Total: 159 mg/dL (ref 100–199)
HDL: 50 mg/dL (ref >39)
LDL Chol Calc (NIH): 90 mg/dL (ref 0–99)
Triglycerides: 101 mg/dL (ref 0–149)
VLDL Cholesterol Cal: 19 mg/dL (ref 5–40)

## 2021-09-04 LAB — LIPOPROTEIN A (LPA): Lipoprotein (a): 214.7 nmol/L — ABNORMAL HIGH (ref <75.0)

## 2021-09-04 LAB — TESTOSTERONE: Testosterone: <3 ng/dL — ABNORMAL LOW (ref 3–67)

## 2021-09-04 LAB — PROGESTERONE: Progesterone: 3.4 ng/mL

## 2021-09-09 ENCOUNTER — Telehealth: Payer: Self-pay | Admitting: *Deleted

## 2021-09-09 DIAGNOSIS — E785 Hyperlipidemia, unspecified: Secondary | ICD-10-CM

## 2021-09-09 MED ORDER — EZETIMIBE 10 MG PO TABS: 10.0000 mg | ORAL_TABLET | Freq: Every day | ORAL | 3 refills | Status: DC

## 2021-09-09 NOTE — Telephone Encounter (Signed)
Rollene Rotunda, MD  09/08/2021  7:15 PM EST     I would start Zetia 10 mg po daily.  Disp number 30 with 11 refills.     pt aware of results  New script sent to the pharmacy  Lab orders mailed to the pt

## 2021-09-25 ENCOUNTER — Other Ambulatory Visit: Payer: Self-pay | Admitting: Cardiology

## 2021-10-23 LAB — BASIC METABOLIC PANEL
BUN: 14 (ref 4–21)
CO2: 27 — AB (ref 13–22)
Chloride: 108 (ref 99–108)
Creatinine: 0.8 (ref 0.5–1.1)
Glucose: 86
Potassium: 4 (ref 3.4–5.3)
Sodium: 140 (ref 137–147)

## 2021-10-23 LAB — HEPATIC FUNCTION PANEL
ALT: 51 — AB (ref 7–35)
AST: 45 — AB (ref 13–35)

## 2021-10-23 LAB — COMPREHENSIVE METABOLIC PANEL
Albumin: 4 (ref 3.5–5.0)
Calcium: 8.9 (ref 8.7–10.7)

## 2021-12-06 ENCOUNTER — Encounter: Payer: Self-pay | Admitting: *Deleted

## 2021-12-23 ENCOUNTER — Encounter: Payer: Self-pay | Admitting: Internal Medicine

## 2021-12-23 ENCOUNTER — Telehealth: Payer: Self-pay | Admitting: Cardiology

## 2021-12-23 ENCOUNTER — Encounter: Payer: Self-pay | Admitting: *Deleted

## 2021-12-23 NOTE — Telephone Encounter (Signed)
Spoke with pt, she reports she needs a letter from the doctor regarding her medications and the diagnosis for those medications. Aware will generate the letter regarding cardiac medications and place in the mail at her request.

## 2021-12-23 NOTE — Telephone Encounter (Signed)
Patient says that she is leaving the country and she needs an idomized copy of her medication of what she is taking and what its used for.

## 2022-01-01 ENCOUNTER — Other Ambulatory Visit: Payer: Self-pay

## 2022-01-01 ENCOUNTER — Encounter: Payer: Self-pay | Admitting: Internal Medicine

## 2022-01-01 ENCOUNTER — Ambulatory Visit (INDEPENDENT_AMBULATORY_CARE_PROVIDER_SITE_OTHER): Payer: Medicare Other | Admitting: Internal Medicine

## 2022-01-01 VITALS — BP 118/80 | HR 81 | Temp 97.9°F | Ht 65.2 in | Wt 152.8 lb

## 2022-01-01 DIAGNOSIS — U099 Post covid-19 condition, unspecified: Secondary | ICD-10-CM | POA: Diagnosis not present

## 2022-01-01 DIAGNOSIS — I471 Supraventricular tachycardia: Secondary | ICD-10-CM

## 2022-01-01 DIAGNOSIS — N951 Menopausal and female climacteric states: Secondary | ICD-10-CM

## 2022-01-01 DIAGNOSIS — M810 Age-related osteoporosis without current pathological fracture: Secondary | ICD-10-CM

## 2022-01-01 MED ORDER — CYANOCOBALAMIN 1000 MCG/ML IJ SOLN
1000.0000 ug | Freq: Once | INTRAMUSCULAR | Status: AC
  Administered 2022-01-01: 1000 ug via INTRAMUSCULAR

## 2022-01-01 NOTE — Progress Notes (Signed)
?Rich Brave Llittleton,acting as a Education administrator for Maximino Greenland, MD.,have documented all relevant documentation on the behalf of Maximino Greenland, MD,as directed by  Maximino Greenland, MD while in the presence of Maximino Greenland, MD.  ?This visit occurred during the SARS-CoV-2 public health emergency.  Safety protocols were in place, including screening questions prior to the visit, additional usage of staff PPE, and extensive cleaning of exam room while observing appropriate contact time as indicated for disinfecting solutions. ? ?Subjective:  ?  ? Patient ID: Andrea Mendoza , female    DOB: 01-10-1956 , 66 y.o.   MRN: [id number] ? ? ?Chief Complaint  ?Patient presents with  ? BHRT F/U  ? ? ?HPI ? ?She presents today for f/u BHRT. She feels well on her current regimen. She now using Bi-est, progesterone nightly, DHEA and supplemental testosterone.  She has no new concerns or complaints at this time.  ? ?Unfortunately, since her last visit she was diagnosed with COVID. She is still feeling fatigued. No longer with URI symptoms. She is excited about her upcoming trip to [place] and is thankful for letter stating reason for prescription meds.  ?  ? ?Past Medical History:  ?Diagnosis Date  ? Abnormal vaginal Pap smear   ? History of fibrocystic disease of breast   ? Hx of migraines   ? Kidney stones   ? Osteopenia   ? Post menopausal problems   ? Transient hypertension   ?  ? ?Family History  ?Problem Relation Age of Onset  ? Heart attack Father   ? Heart attack Mother   ? Dementia Other   ? CVA Other   ? CAD Other   ? Osteoporosis Other   ? Stroke Other   ?     X 2  ? Colon polyps Other   ? CVA Other   ? CAD Other   ? Breast cancer Other   ? ? ? ?Current Outpatient Medications:  ?  Aspirin 81 MG CAPS, Take 81 mg by mouth daily., Disp: , Rfl:  ?  CALCIUM PO, Take 1 tablet by mouth daily at 2 am., Disp: , Rfl:  ?  Cholecalciferol (VITAMIN D) 50 MCG (2000 UT) tablet, Take 2,000 Units by mouth at bedtime., Disp: , Rfl:  ?   denosumab (PROLIA) 60 MG/ML SOSY injection, Inject 60 mg into the skin every 6 (six) months., Disp: , Rfl:  ?  Estradiol-Estriol-Progesterone (BIEST/PROGESTERONE) CREA, Place 1 application onto the skin every Monday, Tuesday, Wednesday, Thursday, and Friday. 0.2(0.16-0.04) mg/ml Monday - Saturday, Disp: , Rfl:  ?  ezetimibe (ZETIA) 10 MG tablet, Take 1 tablet (10 mg total) by mouth daily., Disp: 90 tablet, Rfl: 3 ?  metoprolol tartrate (LOPRESSOR) 25 MG tablet, TAKE 1/2 (ONE-HALF) TABLET BY MOUTH AS NEEDED, Disp: 45 tablet, Rfl: 3 ?  Progesterone Micronized (PROGESTERONE PO), Take 1 capsule by mouth. 150 mg progesterone Monday - Friday  and 5 mg dhea, Disp: , Rfl:  ?  rosuvastatin (CRESTOR) 40 MG tablet, Take 1 tablet (40 mg total) by mouth daily., Disp: 90 tablet, Rfl: 3 ?  TESTOSTERONE COMPOUNDING KIT TD, Place 1 application onto the skin See admin instructions. 0.02%  0.22m /ml apply topically daily except on sundays  Monday - FRIDAY, Disp: , Rfl:   ? ?No Known Allergies  ? ?Review of Systems  ?Constitutional: Negative.   ?Respiratory: Negative.    ?Cardiovascular: Negative.   ?Gastrointestinal: Negative.   ?Neurological: Negative.   ?Psychiatric/Behavioral: Negative.     ? ?  Today's Vitals  ? 01/01/22 1521  ?BP: 118/80  ?Pulse: 81  ?Temp: 97.9 ?F (36.6 ?C)  ?Weight: 152 lb 12.8 oz (69.3 kg)  ?Height: 5' 5.2" (1.656 m)  ?PainSc: 0-No pain  ? ?Body mass index is 25.27 kg/m?.  ?Wt Readings from Last 3 Encounters:  ?01/01/22 152 lb 12.8 oz (69.3 kg)  ?09/03/21 161 lb 6.4 oz (73.2 kg)  ?04/24/21 158 lb 12.8 oz (72 kg)  ?  ? ?Objective:  ?Physical Exam ?Vitals and nursing note reviewed.  ?Constitutional:   ?   Appearance: Normal appearance.  ?HENT:  ?   Head: Normocephalic and atraumatic.  ?   Nose:  ?   Comments: Masked  ?   Mouth/Throat:  ?   Comments: Masked  ?Cardiovascular:  ?   Rate and Rhythm: Normal rate and regular rhythm.  ?   Heart sounds: Normal heart sounds.  ?Pulmonary:  ?   Effort: Pulmonary effort is  normal.  ?   Breath sounds: Normal breath sounds.  ?Musculoskeletal:  ?   Cervical back: Normal range of motion.  ?Skin: ?   General: Skin is warm.  ?Neurological:  ?   General: No focal deficit present.  ?   Mental Status: She is alert.  ?Psychiatric:     ?   Mood and Affect: Mood normal.     ?   Behavior: Behavior normal.  ?    ?Assessment And Plan:  ?   ?1. Female climacteric state ?Comments: Sx are stable with BHRT. NO med changes - c/w Bi-est, progesterone/DHEA/Testosterone.  She is reminded to perform monthly SBE and get annual mammograms. F/u in 4 mos.  ? ?2. Osteoporosis of femur without pathological fracture ?Comments: She is no longer on Fosamax, now on Prolia. She is due for her next injection in June 2023.  She should c/w calcium/vit D supplementation.  ? ?3. AVNRT (AV nodal re-entry tachycardia) ([place]) ?Comments: Chronic, followed by Cardiology. She denies recent episodes of tachycardia. She is s/p ablation and will c/w metoprolol qd.  ? ?4. Post-COVID syndrome ?Comments: She agrees to vitamin B12 injection x 1.  ?- cyanocobalamin ((VITAMIN B-12)) injection 1,000 mcg ?  ?Patient was given opportunity to ask questions. Patient verbalized understanding of the plan and was able to repeat key elements of the plan. All questions were answered to their satisfaction.  ? ?I, Maximino Greenland, MD, have reviewed all documentation for this visit. The documentation on 01/01/22 for the exam, diagnosis, procedures, and orders are all accurate and complete.  ? ?IF YOU HAVE BEEN REFERRED TO A SPECIALIST, IT MAY TAKE 1-2 WEEKS TO SCHEDULE/PROCESS THE REFERRAL. IF YOU HAVE NOT HEARD FROM US/SPECIALIST IN TWO WEEKS, PLEASE GIVE [place] A CALL AT [phone] X [phone].  ? ?THE PATIENT IS ENCOURAGED TO PRACTICE SOCIAL DISTANCING DUE TO THE COVID-19 PANDEMIC.   ?

## 2022-02-27 LAB — LIPID PANEL
Chol/HDL Ratio: 2.3 ratio (ref 0.0–4.4)
Cholesterol, Total: 127 mg/dL (ref 100–199)
HDL: 56 mg/dL (ref >39)
LDL Chol Calc (NIH): 56 mg/dL (ref 0–99)
Triglycerides: 74 mg/dL (ref 0–149)
VLDL Cholesterol Cal: 15 mg/dL (ref 5–40)

## 2022-02-28 ENCOUNTER — Other Ambulatory Visit: Payer: Self-pay | Admitting: Internal Medicine

## 2022-02-28 ENCOUNTER — Other Ambulatory Visit: Payer: Self-pay | Admitting: Family Medicine

## 2022-02-28 DIAGNOSIS — Z1231 Encounter for screening mammogram for malignant neoplasm of breast: Secondary | ICD-10-CM

## 2022-03-21 ENCOUNTER — Other Ambulatory Visit: Payer: Self-pay | Admitting: Urology

## 2022-03-31 ENCOUNTER — Other Ambulatory Visit: Payer: Self-pay | Admitting: Cardiology

## 2022-04-15 ENCOUNTER — Ambulatory Visit
Admission: RE | Admit: 2022-04-15 | Discharge: 2022-04-15 | Disposition: A | Discharge status: Home / Self Care | Payer: Medicare Other | Source: Ambulatory Visit | Attending: Family Medicine | Admitting: Family Medicine

## 2022-04-15 DIAGNOSIS — Z1231 Encounter for screening mammogram for malignant neoplasm of breast: Secondary | ICD-10-CM

## 2022-05-12 ENCOUNTER — Encounter (HOSPITAL_COMMUNITY): Payer: Medicare Other

## 2022-05-15 NOTE — Progress Notes (Addendum)
COVID Vaccine Completed: yes x5  Date of COVID positive in last 90 days: no  PCP - Joycelyn Rua, MD Cardiologist - Rollene Rotunda, MD Electrophysiologist- Loman Brooklyn, MD  Chest x-ray - n/a EKG - 05/16/22 Epic/chart Stress Test - 05/04/20 Epic ECHO - n/a Cardiac Cath - ablation 2020 Pacemaker/ICD device last checked: n/a Spinal Cord Stimulator: n/a  Bowel Prep - mirlax, and clears day before  Sleep Study - n/a CPAP -   Fasting Blood Sugar - n/a Checks Blood Sugar _____ times a day  Blood Thinner Instructions: Aspirin Instructions: ASA 81, hold 7 days Last Dose:  Activity level: Can go up a flight of stairs and perform activities of daily living without stopping and without symptoms of chest pain or shortness of breath.   Anesthesia review:   Patient denies shortness of breath, fever, cough and chest pain at PAT appointment  Patient verbalized understanding of instructions that were given to them at the PAT appointment. Patient was also instructed that they will need to review over the PAT instructions again at home before surgery.

## 2022-05-15 NOTE — Patient Instructions (Addendum)
DUE TO COVID-19 ONLY TWO VISITORS  (aged 68 and older)  ARE ALLOWED TO COME WITH YOU AND STAY IN THE WAITING ROOM ONLY DURING PRE OP AND PROCEDURE.   **NO VISITORS ARE ALLOWED IN THE SHORT STAY AREA OR RECOVERY ROOM!!**  IF YOU WILL BE ADMITTED INTO THE HOSPITAL YOU ARE ALLOWED ONLY FOUR SUPPORT PEOPLE DURING VISITATION HOURS ONLY (7 AM -8PM)   The support person(s) must pass our screening, gel in and out, and wear a mask at all times, including in the patient's room. Patients must also wear a mask when staff or their support person are in the room. Visitors GUEST BADGE MUST BE WORN VISIBLY  One adult visitor may remain with you overnight and MUST be in the room by 8 P.M.     Your procedure is scheduled on: 05/29/22   Report to [facility] Main Entrance    Report to admitting at 9:45 AM   Call this number if you have problems the morning of surgery [phone]   Do not eat food :After Midnight.   After Midnight you may have the following liquids until 9:00 AM DAY OF SURGERY  Water Black Coffee (sugar ok, NO MILK/CREAM OR CREAMERS)  Tea (sugar ok, NO MILK/CREAM OR CREAMERS) regular and decaf                             Plain Jell-O (NO RED)                                           Fruit ices (not with fruit pulp, NO RED)                                     Popsicles (NO RED)                                                                  Juice: apple, WHITE grape, WHITE cranberry Sports drinks like Gatorade (NO RED) Clear broth(vegetable,chicken,beef)  FOLLOW BOWEL PREP AND ANY ADDITIONAL PRE OP INSTRUCTIONS YOU RECEIVED FROM YOUR SURGEON'S OFFICE!!!     Oral Hygiene is also important to reduce your risk of infection.                                    Remember - BRUSH YOUR TEETH THE MORNING OF SURGERY WITH YOUR REGULAR TOOTHPASTE   Take these medicines the morning of surgery with A SIP OF WATER: Zetia, Metoprolol, Rosuvastatin                               You may  not have any metal on your body including hair pins, jewelry, and body piercing             Do not wear make-up, lotions, powders, perfumes, or deodorant  Do not wear nail polish including gel and S&S, artificial/acrylic nails, or any other type  of covering on natural nails including finger and toenails. If you have artificial nails, gel coating, etc. that needs to be removed by a nail salon please have this removed prior to surgery or surgery may need to be canceled/ delayed if the surgeon/ anesthesia feels like they are unable to be safely monitored.   Do not shave  48 hours prior to surgery.    Do not bring valuables to the hospital. [facility] IS NOT             RESPONSIBLE   FOR VALUABLES.   Bring small overnight bag day of surgery.   DO NOT BRING YOUR HOME MEDICATIONS TO THE HOSPITAL. PHARMACY WILL DISPENSE MEDICATIONS LISTED ON YOUR MEDICATION LIST TO YOU DURING YOUR ADMISSION IN THE HOSPITAL!    Special Instructions: Bring a copy of your healthcare power of attorney and living will documents         the day of surgery if you haven't scanned them before.              Please read over the following fact sheets you were given: IF YOU HAVE QUESTIONS ABOUT YOUR PRE-OP INSTRUCTIONS PLEASE CALL [phone]- [facility] - Preparing for Surgery Before surgery, you can play an important role.  Because skin is not sterile, your skin needs to be as free of germs as possible.  You can reduce the number of germs on your skin by washing with CHG (chlorahexidine gluconate) soap before surgery.  CHG is an antiseptic cleaner which kills germs and bonds with the skin to continue killing germs even after washing. Please DO NOT use if you have an allergy to CHG or antibacterial soaps.  If your skin becomes reddened/irritated stop using the CHG and inform your nurse when you arrive at Short Stay. Do not shave (including legs and underarms) for at least 48 hours prior to the first CHG shower.   You may shave your face/neck.  Please follow these instructions carefully:  1.  Shower with CHG Soap the night before surgery and the  morning of surgery.  2.  If you choose to wash your hair, wash your hair first as usual with your normal  shampoo.  3.  After you shampoo, rinse your hair and body thoroughly to remove the shampoo.                             4.  Use CHG as you would any other liquid soap.  You can apply chg directly to the skin and wash.  Gently with a scrungie or clean washcloth.  5.  Apply the CHG Soap to your body ONLY FROM THE NECK DOWN.   Do   not use on face/ open                           Wound or open sores. Avoid contact with eyes, ears mouth and   genitals (private parts).                       Wash face,  Genitals (private parts) with your normal soap.             6.  Wash thoroughly, paying special attention to the area where your    surgery  will be performed.  7.  Thoroughly rinse your body with warm water from the  neck down.  8.  DO NOT shower/wash with your normal soap after using and rinsing off the CHG Soap.                9.  Pat yourself dry with a clean towel.            10.  Wear clean pajamas.            11.  Place clean sheets on your bed the night of your first shower and do not  sleep with pets. Day of Surgery : Do not apply any lotions/deodorants the morning of surgery.  Please wear clean clothes to the hospital/surgery center.  FAILURE TO FOLLOW THESE INSTRUCTIONS MAY RESULT IN THE CANCELLATION OF YOUR SURGERY  PATIENT SIGNATURE_________________________________  NURSE SIGNATURE__________________________________  ________________________________________________________________________ [facility] Before surgery, you can play an important role.  Because skin is not sterile, your skin needs to be as free of germs as possible.  You can reduce the number of germs on your skin by washing with CHG (chlorahexidine gluconate) soap  before surgery.  CHG is an antiseptic cleaner which kills germs and bonds with the skin to continue killing germs even after washing. Please DO NOT use if you have an allergy to CHG or antibacterial soaps.  If your skin becomes reddened/irritated stop using the CHG and inform your nurse when you arrive at Short Stay. Do not shave (including legs and underarms) for at least 48 hours prior to the first CHG shower.  You may shave your face/neck.  Please follow these instructions carefully:  1.  Shower with CHG Soap the night before surgery and the  morning of surgery.  2.  If you choose to wash your hair, wash your hair first as usual with your normal  shampoo.  3.  After you shampoo, rinse your hair and body thoroughly to remove the shampoo.                             4.  Use CHG as you would any other liquid soap.  You can apply chg directly to the skin and wash.  Gently with a scrungie or clean washcloth.  5.  Apply the CHG Soap to your body ONLY FROM THE NECK DOWN.   Do   not use on face/ open                           Wound or open sores. Avoid contact with eyes, ears mouth and   genitals (private parts).                       Wash face,  Genitals (private parts) with your normal soap.             6.  Wash thoroughly, paying special attention to the area where your    surgery  will be performed.  7.  Thoroughly rinse your body with warm water from the neck down.  8.  DO NOT shower/wash with your normal soap after using and rinsing off the CHG Soap.                9.  Pat yourself dry with a clean towel.            10.  Wear clean pajamas.            11.  Place clean sheets on your bed the night of your first shower and do not  sleep with pets. Day of Surgery : Do not apply any lotions/deodorants the morning of surgery.  Please wear clean clothes to the hospital/surgery center.  FAILURE TO FOLLOW THESE INSTRUCTIONS MAY RESULT IN THE CANCELLATION OF YOUR SURGERY  PATIENT  SIGNATURE_________________________________  NURSE SIGNATURE__________________________________  ________________________________________________________________________   WHAT IS A BLOOD TRANSFUSION? Blood Transfusion Information  A transfusion is the replacement of blood or some of its parts. Blood is made up of multiple cells which provide different functions. Red blood cells carry oxygen and are used for blood loss replacement. White blood cells fight against infection. Platelets control bleeding. Plasma helps clot blood. Other blood products are available for specialized needs, such as hemophilia or other clotting disorders. BEFORE THE TRANSFUSION  Who gives blood for transfusions?  Healthy volunteers who are fully evaluated to make sure their blood is safe. This is blood bank blood. Transfusion therapy is the safest it has ever been in the practice of medicine. Before blood is taken from a donor, a complete history is taken to make sure that person has no history of diseases nor engages in risky social behavior (examples are intravenous drug use or sexual activity with multiple partners). The donor's travel history is screened to minimize risk of transmitting infections, such as malaria. The donated blood is tested for signs of infectious diseases, such as HIV and hepatitis. The blood is then tested to be sure it is compatible with you in order to minimize the chance of a transfusion reaction. If you or a relative donates blood, this is often done in anticipation of surgery and is not appropriate for emergency situations. It takes many days to process the donated blood. RISKS AND COMPLICATIONS Although transfusion therapy is very safe and saves many lives, the main dangers of transfusion include:  Getting an infectious disease. Developing a transfusion reaction. This is an allergic reaction to something in the blood you were given. Every precaution is taken to prevent this. The decision to  have a blood transfusion has been considered carefully by your caregiver before blood is given. Blood is not given unless the benefits outweigh the risks. AFTER THE TRANSFUSION Right after receiving a blood transfusion, you will usually feel much better and more energetic. This is especially true if your red blood cells have gotten low (anemic). The transfusion raises the level of the red blood cells which carry oxygen, and this usually causes an energy increase. The nurse administering the transfusion will monitor you carefully for complications. HOME CARE INSTRUCTIONS  No special instructions are needed after a transfusion. You may find your energy is better. Speak with your caregiver about any limitations on activity for underlying diseases you may have. SEEK MEDICAL CARE IF:  Your condition is not improving after your transfusion. You develop redness or irritation at the intravenous (IV) site. SEEK IMMEDIATE MEDICAL CARE IF:  Any of the following symptoms occur over the next 12 hours: Shaking chills. You have a temperature by mouth above 102 F (38.9 C), not controlled by medicine. Chest, back, or muscle pain. People around you feel you are not acting correctly or are confused. Shortness of breath or difficulty breathing. Dizziness and fainting. You get a rash or develop hives. You have a decrease in urine output. Your urine turns a dark color or changes to pink, red, or brown. Any of the following symptoms occur over the next 10 days: You have a  temperature by mouth above 102 F (38.9 C), not controlled by medicine. Shortness of breath. Weakness after normal activity. The white part of the eye turns yellow (jaundice). You have a decrease in the amount of urine or are urinating less often. Your urine turns a dark color or changes to pink, red, or brown. Document Released: 10/17/2000 Document Revised: 01/12/2012 Document Reviewed: 06/05/2008 [facility] Patient Information 2014  [place], [place].  _______________________________________________________________________

## 2022-05-16 ENCOUNTER — Encounter (HOSPITAL_COMMUNITY): Payer: Self-pay

## 2022-05-16 ENCOUNTER — Encounter (HOSPITAL_COMMUNITY)
Admission: RE | Admit: 2022-05-16 | Discharge: 2022-05-16 | Disposition: A | Discharge status: Home / Self Care | Payer: Medicare Other | Source: Ambulatory Visit | Attending: Urology | Admitting: Urology

## 2022-05-16 VITALS — BP 141/85 | HR 70 | Resp 14 | Ht 66.0 in | Wt 150.0 lb

## 2022-05-16 DIAGNOSIS — I10 Essential (primary) hypertension: Secondary | ICD-10-CM | POA: Diagnosis not present

## 2022-05-16 DIAGNOSIS — Z01818 Encounter for other preprocedural examination: Secondary | ICD-10-CM | POA: Insufficient documentation

## 2022-05-16 HISTORY — DX: Cardiac arrhythmia, unspecified: I49.9

## 2022-05-16 LAB — CBC
HCT: 41.6 % (ref 36.0–46.0)
Hemoglobin: 13.7 g/dL (ref 12.0–15.0)
MCH: 30.8 pg (ref 26.0–34.0)
MCHC: 32.9 g/dL (ref 30.0–36.0)
MCV: 93.5 fL (ref 80.0–100.0)
Platelets: 231 10*3/uL (ref 150–400)
RBC: 4.45 MIL/uL (ref 3.87–5.11)
RDW: 12.7 % (ref 11.5–15.5)
WBC: 5.4 10*3/uL (ref 4.0–10.5)
nRBC: 0 % (ref 0.0–0.2)

## 2022-05-16 LAB — BASIC METABOLIC PANEL
Anion gap: 7 (ref 5–15)
BUN: 17 mg/dL (ref 8–23)
CO2: 24 mmol/L (ref 22–32)
Calcium: 9.1 mg/dL (ref 8.9–10.3)
Chloride: 108 mmol/L (ref 98–111)
Creatinine, Ser: 0.85 mg/dL (ref 0.44–1.00)
GFR, Estimated: >60 mL/min (ref >60)
Glucose, Bld: 102 mg/dL — ABNORMAL HIGH (ref 70–99)
Potassium: 3.7 mmol/L (ref 3.5–5.1)
Sodium: 139 mmol/L (ref 135–145)

## 2022-05-16 LAB — TYPE AND SCREEN
ABO/RH(D): O NEG
Antibody Screen: NEGATIVE

## 2022-05-20 ENCOUNTER — Ambulatory Visit: Payer: Medicare Other | Admitting: Internal Medicine

## 2022-05-29 ENCOUNTER — Inpatient Hospital Stay (HOSPITAL_COMMUNITY): Payer: Medicare Other

## 2022-05-29 ENCOUNTER — Other Ambulatory Visit: Payer: Self-pay

## 2022-05-29 ENCOUNTER — Encounter (HOSPITAL_COMMUNITY)
Admission: RE | Disposition: A | Discharge status: Home / Self Care | Payer: Self-pay | Source: Ambulatory Visit | Attending: Urology

## 2022-05-29 ENCOUNTER — Inpatient Hospital Stay (HOSPITAL_COMMUNITY): Payer: Medicare Other | Admitting: Anesthesiology

## 2022-05-29 ENCOUNTER — Encounter (HOSPITAL_COMMUNITY): Payer: Self-pay | Admitting: Urology

## 2022-05-29 ENCOUNTER — Observation Stay (HOSPITAL_COMMUNITY)
Admission: RE | Admit: 2022-05-29 | Discharge: 2022-05-31 | Disposition: A | Discharge status: Home / Self Care | Payer: Medicare Other | Source: Ambulatory Visit | Attending: Urology | Admitting: Urology

## 2022-05-29 ENCOUNTER — Inpatient Hospital Stay (HOSPITAL_BASED_OUTPATIENT_CLINIC_OR_DEPARTMENT_OTHER): Payer: Medicare Other | Admitting: Anesthesiology

## 2022-05-29 DIAGNOSIS — N393 Stress incontinence (female) (male): Principal | ICD-10-CM | POA: Insufficient documentation

## 2022-05-29 DIAGNOSIS — N819 Female genital prolapse, unspecified: Secondary | ICD-10-CM

## 2022-05-29 DIAGNOSIS — N8111 Cystocele, midline: Principal | ICD-10-CM | POA: Insufficient documentation

## 2022-05-29 DIAGNOSIS — Z79899 Other long term (current) drug therapy: Secondary | ICD-10-CM | POA: Insufficient documentation

## 2022-05-29 DIAGNOSIS — I10 Essential (primary) hypertension: Secondary | ICD-10-CM | POA: Diagnosis not present

## 2022-05-29 DIAGNOSIS — I471 Supraventricular tachycardia: Secondary | ICD-10-CM

## 2022-05-29 HISTORY — PX: ROBOTIC ASSISTED LAPAROSCOPIC SACROCOLPOPEXY: SHX5388

## 2022-05-29 LAB — ABO/RH: ABO/RH(D): O NEG

## 2022-05-29 SURGERY — SACROCOLPOPEXY, ROBOT-ASSISTED, LAPAROSCOPIC
Anesthesia: General

## 2022-05-29 MED ORDER — SUGAMMADEX SODIUM 200 MG/2ML IV SOLN
INTRAVENOUS | Status: DC | PRN
  Administered 2022-05-29: 150 mg via INTRAVENOUS

## 2022-05-29 MED ORDER — BUPIVACAINE LIPOSOME 1.3 % IJ SUSP
INTRAMUSCULAR | Status: DC | PRN
  Administered 2022-05-29: 20 mL

## 2022-05-29 MED ORDER — STERILE WATER FOR IRRIGATION IR SOLN
Status: DC | PRN
  Administered 2022-05-29: 1000 mL

## 2022-05-29 MED ORDER — LACTATED RINGERS IV SOLN: INTRAVENOUS | Status: DC

## 2022-05-29 MED ORDER — ORAL CARE MOUTH RINSE: 15.0000 mL | Freq: Once | OROMUCOSAL | Status: AC

## 2022-05-29 MED ORDER — ONDANSETRON HCL 4 MG/2ML IJ SOLN
INTRAMUSCULAR | Status: DC | PRN
  Administered 2022-05-29: 4 mg via INTRAVENOUS

## 2022-05-29 MED ORDER — TRAMADOL HCL 50 MG PO TABS
50.0000 mg | ORAL_TABLET | Freq: Four times a day (QID) | ORAL | Status: DC | PRN
  Administered 2022-05-30: 50 mg via ORAL
  Administered 2022-05-30 – 2022-05-31 (×2): 100 mg via ORAL
  Filled 2022-05-29 (×2): qty 2
  Filled 2022-05-29: qty 1

## 2022-05-29 MED ORDER — PROPOFOL 10 MG/ML IV BOLUS
INTRAVENOUS | Status: AC
  Filled 2022-05-29: qty 20

## 2022-05-29 MED ORDER — ACETAMINOPHEN 325 MG PO TABS: 325.0000 mg | ORAL_TABLET | ORAL | Status: DC | PRN

## 2022-05-29 MED ORDER — CEFAZOLIN SODIUM-DEXTROSE 2-4 GM/100ML-% IV SOLN
2.0000 g | INTRAVENOUS | Status: AC
  Administered 2022-05-29: 2 g via INTRAVENOUS
  Filled 2022-05-29: qty 100

## 2022-05-29 MED ORDER — FENTANYL CITRATE (PF) 250 MCG/5ML IJ SOLN
INTRAMUSCULAR | Status: AC
  Filled 2022-05-29: qty 5

## 2022-05-29 MED ORDER — POLYETHYLENE GLYCOL 3350 17 GM/SCOOP PO POWD: 1.0000 | Freq: Once | ORAL | Status: DC

## 2022-05-29 MED ORDER — FENTANYL CITRATE (PF) 100 MCG/2ML IJ SOLN
INTRAMUSCULAR | Status: DC | PRN
  Administered 2022-05-29 (×2): 50 ug via INTRAVENOUS
  Administered 2022-05-29: 100 ug via INTRAVENOUS
  Administered 2022-05-29 (×3): 50 ug via INTRAVENOUS

## 2022-05-29 MED ORDER — ONDANSETRON HCL 4 MG/2ML IJ SOLN
INTRAMUSCULAR | Status: AC
  Filled 2022-05-29: qty 2

## 2022-05-29 MED ORDER — SODIUM CHLORIDE 0.45 % IV SOLN: INTRAVENOUS | Status: DC

## 2022-05-29 MED ORDER — BUPIVACAINE-EPINEPHRINE (PF) 0.5% -1:200000 IJ SOLN
INTRAMUSCULAR | Status: AC
  Filled 2022-05-29: qty 30

## 2022-05-29 MED ORDER — HYDROMORPHONE HCL 1 MG/ML IJ SOLN: 0.5000 mg | INTRAMUSCULAR | Status: DC | PRN

## 2022-05-29 MED ORDER — DEXAMETHASONE SODIUM PHOSPHATE 10 MG/ML IJ SOLN
INTRAMUSCULAR | Status: DC | PRN
  Administered 2022-05-29: 10 mg via INTRAVENOUS

## 2022-05-29 MED ORDER — FENTANYL CITRATE PF 50 MCG/ML IJ SOSY
25.0000 ug | PREFILLED_SYRINGE | INTRAMUSCULAR | Status: DC | PRN
  Administered 2022-05-29 (×3): 50 ug via INTRAVENOUS

## 2022-05-29 MED ORDER — OXYCODONE HCL 5 MG PO TABS: 5.0000 mg | ORAL_TABLET | Freq: Once | ORAL | Status: DC | PRN

## 2022-05-29 MED ORDER — BUPIVACAINE LIPOSOME 1.3 % IJ SUSP
INTRAMUSCULAR | Status: AC
  Filled 2022-05-29: qty 20

## 2022-05-29 MED ORDER — ESTRADIOL 0.1 MG/GM VA CREA
TOPICAL_CREAM | VAGINAL | Status: AC
  Filled 2022-05-29: qty 42.5

## 2022-05-29 MED ORDER — PROPOFOL 10 MG/ML IV BOLUS
INTRAVENOUS | Status: DC | PRN
  Administered 2022-05-29: 120 mg via INTRAVENOUS

## 2022-05-29 MED ORDER — ESTRADIOL 0.1 MG/GM VA CREA
TOPICAL_CREAM | VAGINAL | Status: DC | PRN
  Administered 2022-05-29: 1 via VAGINAL

## 2022-05-29 MED ORDER — ONDANSETRON HCL 4 MG/2ML IJ SOLN: 4.0000 mg | Freq: Once | INTRAMUSCULAR | Status: DC | PRN

## 2022-05-29 MED ORDER — ESTRADIOL 0.1 MG/GM VA CREA: 1.0000 | TOPICAL_CREAM | VAGINAL | 1 refills | Status: AC

## 2022-05-29 MED ORDER — MIDAZOLAM HCL 2 MG/2ML IJ SOLN
INTRAMUSCULAR | Status: AC
  Filled 2022-05-29: qty 2

## 2022-05-29 MED ORDER — SODIUM CHLORIDE 0.9 % IR SOLN
Status: DC | PRN
  Administered 2022-05-29: 3000 mL

## 2022-05-29 MED ORDER — ONDANSETRON HCL 4 MG/2ML IJ SOLN
4.0000 mg | Freq: Four times a day (QID) | INTRAMUSCULAR | Status: DC | PRN
  Administered 2022-05-29 – 2022-05-30 (×2): 4 mg via INTRAVENOUS
  Filled 2022-05-29 (×2): qty 2

## 2022-05-29 MED ORDER — PHENYLEPHRINE 80 MCG/ML (10ML) SYRINGE FOR IV PUSH (FOR BLOOD PRESSURE SUPPORT)
PREFILLED_SYRINGE | INTRAVENOUS | Status: DC | PRN
  Administered 2022-05-29: 160 ug via INTRAVENOUS

## 2022-05-29 MED ORDER — LACTATED RINGERS IR SOLN
Status: DC | PRN
  Administered 2022-05-29: 1000 mL

## 2022-05-29 MED ORDER — SODIUM CHLORIDE (PF) 0.9 % IJ SOLN
INTRAMUSCULAR | Status: AC
  Filled 2022-05-29: qty 20

## 2022-05-29 MED ORDER — FENTANYL CITRATE PF 50 MCG/ML IJ SOSY
PREFILLED_SYRINGE | INTRAMUSCULAR | Status: AC
  Filled 2022-05-29: qty 1

## 2022-05-29 MED ORDER — SULFAMETHOXAZOLE-TRIMETHOPRIM 800-160 MG PO TABS: 1.0000 | ORAL_TABLET | Freq: Two times a day (BID) | ORAL | 0 refills | Status: DC

## 2022-05-29 MED ORDER — GELATIN ABSORBABLE 12-7 MM EX MISC
CUTANEOUS | Status: DC | PRN
  Administered 2022-05-29 (×2): 1

## 2022-05-29 MED ORDER — MIDAZOLAM HCL 5 MG/5ML IJ SOLN
INTRAMUSCULAR | Status: DC | PRN
  Administered 2022-05-29: 2 mg via INTRAVENOUS

## 2022-05-29 MED ORDER — LIDOCAINE 2% (20 MG/ML) 5 ML SYRINGE
INTRAMUSCULAR | Status: DC | PRN
  Administered 2022-05-29: 60 mg via INTRAVENOUS

## 2022-05-29 MED ORDER — FENTANYL CITRATE (PF) 100 MCG/2ML IJ SOLN
INTRAMUSCULAR | Status: AC
  Filled 2022-05-29: qty 2

## 2022-05-29 MED ORDER — BUPIVACAINE-EPINEPHRINE 0.5% -1:200000 IJ SOLN
INTRAMUSCULAR | Status: DC | PRN
  Administered 2022-05-29: 11 mL

## 2022-05-29 MED ORDER — TRAMADOL HCL 50 MG PO TABS: 50.0000 mg | ORAL_TABLET | Freq: Four times a day (QID) | ORAL | 0 refills | Status: DC | PRN

## 2022-05-29 MED ORDER — HYDRALAZINE HCL 20 MG/ML IJ SOLN: 5.0000 mg | INTRAMUSCULAR | Status: DC | PRN

## 2022-05-29 MED ORDER — CEFAZOLIN SODIUM-DEXTROSE 1-4 GM/50ML-% IV SOLN
1.0000 g | Freq: Three times a day (TID) | INTRAVENOUS | Status: DC
  Administered 2022-05-29 – 2022-05-31 (×5): 1 g via INTRAVENOUS
  Filled 2022-05-29 (×5): qty 50

## 2022-05-29 MED ORDER — ROCURONIUM BROMIDE 10 MG/ML (PF) SYRINGE
PREFILLED_SYRINGE | INTRAVENOUS | Status: DC | PRN
  Administered 2022-05-29: 100 mg via INTRAVENOUS

## 2022-05-29 MED ORDER — ACETAMINOPHEN 10 MG/ML IV SOLN
1000.0000 mg | Freq: Four times a day (QID) | INTRAVENOUS | Status: AC
  Administered 2022-05-29 – 2022-05-30 (×4): 1000 mg via INTRAVENOUS
  Filled 2022-05-29 (×4): qty 100

## 2022-05-29 MED ORDER — FENTANYL CITRATE PF 50 MCG/ML IJ SOSY
PREFILLED_SYRINGE | INTRAMUSCULAR | Status: AC
  Filled 2022-05-29: qty 2

## 2022-05-29 MED ORDER — CHLORHEXIDINE GLUCONATE 0.12 % MT SOLN
15.0000 mL | Freq: Once | OROMUCOSAL | Status: AC
  Administered 2022-05-29: 15 mL via OROMUCOSAL

## 2022-05-29 MED ORDER — OXYCODONE HCL 5 MG/5ML PO SOLN: 5.0000 mg | Freq: Once | ORAL | Status: DC | PRN

## 2022-05-29 MED ORDER — MEPERIDINE HCL 50 MG/ML IJ SOLN: 6.2500 mg | INTRAMUSCULAR | Status: DC | PRN

## 2022-05-29 MED ORDER — KETOROLAC TROMETHAMINE 15 MG/ML IJ SOLN
15.0000 mg | Freq: Four times a day (QID) | INTRAMUSCULAR | Status: DC
  Administered 2022-05-29 – 2022-05-30 (×4): 15 mg via INTRAVENOUS
  Filled 2022-05-29 (×4): qty 1

## 2022-05-29 MED ORDER — ACETAMINOPHEN 160 MG/5ML PO SOLN: 325.0000 mg | ORAL | Status: DC | PRN

## 2022-05-29 SURGICAL SUPPLY — 82 items
ADH SKN CLS APL DERMABOND .7 (GAUZE/BANDAGES/DRESSINGS) ×1
APL PRP STRL LF DISP 70% ISPRP (MISCELLANEOUS) ×1
BAG COUNTER SPONGE SURGICOUNT (BAG) IMPLANT
BAG DRN RND TRDRP ANRFLXCHMBR (UROLOGICAL SUPPLIES)
BAG SPNG CNTER NS LX DISP (BAG)
BAG URINE DRAIN 2000ML AR STRL (UROLOGICAL SUPPLIES) IMPLANT
CATH FOLEY 2WAY SLVR  5CC 16FR (CATHETERS) ×2
CATH FOLEY 2WAY SLVR 5CC 16FR (CATHETERS) ×1 IMPLANT
CHLORAPREP W/TINT 26 (MISCELLANEOUS) ×2 IMPLANT
CLIP LIGATING HEM O LOK PURPLE (MISCELLANEOUS) ×2 IMPLANT
CLIP LIGATING HEMO LOK XL GOLD (MISCELLANEOUS) IMPLANT
CLIP LIGATING HEMO O LOK GREEN (MISCELLANEOUS) IMPLANT
COVER BACK TABLE 60X90IN (DRAPES) ×2 IMPLANT
COVER SURGICAL LIGHT HANDLE (MISCELLANEOUS) ×2 IMPLANT
COVER TIP SHEARS 8 DVNC (MISCELLANEOUS) ×1 IMPLANT
COVER TIP SHEARS 8MM DA VINCI (MISCELLANEOUS) ×2
DERMABOND ADVANCED (GAUZE/BANDAGES/DRESSINGS) ×1
DERMABOND ADVANCED .7 DNX12 (GAUZE/BANDAGES/DRESSINGS) ×1 IMPLANT
DRAIN CHANNEL RND F F (WOUND CARE) IMPLANT
DRAPE ARM DVNC X/XI (DISPOSABLE) ×4 IMPLANT
DRAPE COLUMN DVNC XI (DISPOSABLE) ×1 IMPLANT
DRAPE DA VINCI XI ARM (DISPOSABLE) ×8
DRAPE DA VINCI XI COLUMN (DISPOSABLE) ×2
DRAPE INCISE IOBAN 66X45 STRL (DRAPES) ×2 IMPLANT
DRAPE SHEET LG 3/4 BI-LAMINATE (DRAPES) ×4 IMPLANT
DRAPE SURG IRRIG POUCH 19X23 (DRAPES) ×2 IMPLANT
ELECT PENCIL ROCKER SW 15FT (MISCELLANEOUS) ×2 IMPLANT
ELECT REM PT RETURN 15FT ADLT (MISCELLANEOUS) ×2 IMPLANT
GAUZE 4X4 16PLY ~~LOC~~+RFID DBL (SPONGE) IMPLANT
GLOVE BIO SURGEON STRL SZ 6.5 (GLOVE) ×2 IMPLANT
GLOVE SURG LX 7.5 STRW (GLOVE) ×3
GLOVE SURG LX STRL 7.5 STRW (GLOVE) ×3 IMPLANT
GOWN SRG XL LVL 4 BRTHBL STRL (GOWNS) ×1 IMPLANT
GOWN STRL NON-REIN XL LVL4 (GOWNS) ×2
GOWN STRL REUS W/ TWL XL LVL3 (GOWN DISPOSABLE) ×2 IMPLANT
GOWN STRL REUS W/TWL XL LVL3 (GOWN DISPOSABLE) ×4
HOLDER FOLEY CATH W/STRAP (MISCELLANEOUS) ×2 IMPLANT
IRRIG SUCT STRYKERFLOW 2 WTIP (MISCELLANEOUS) ×2
IRRIGATION SUCT STRKRFLW 2 WTP (MISCELLANEOUS) ×1 IMPLANT
KIT BASIN OR (CUSTOM PROCEDURE TRAY) ×2 IMPLANT
KIT TURNOVER KIT A (KITS) IMPLANT
MANIPULATOR UTERINE 4.5 ZUMI (MISCELLANEOUS) IMPLANT
MARKER SKIN DUAL TIP RULER LAB (MISCELLANEOUS) ×2 IMPLANT
MESH Y UPSYLON VAGINAL (Mesh General) ×1 IMPLANT
OCCLUDER COLPOPNEUMO (BALLOONS) IMPLANT
PACKING VAGINAL (PACKING) ×1 IMPLANT
PAD OB MATERNITY 4.3X12.25 (PERSONAL CARE ITEMS) IMPLANT
PAD POSITIONING PINK XL (MISCELLANEOUS) ×2 IMPLANT
PANTS MESH DISP LRG (UNDERPADS AND DIAPERS) IMPLANT
RETRACTOR LONE STAR DISPOSABLE (INSTRUMENTS) ×1 IMPLANT
RETRACTOR STAY HOOK 5MM (MISCELLANEOUS) ×1 IMPLANT
SEAL CANN UNIV 5-8 DVNC XI (MISCELLANEOUS) ×4 IMPLANT
SEAL XI 5MM-8MM UNIVERSAL (MISCELLANEOUS) ×8
SET IRRIG Y TYPE TUR BLADDER L (SET/KITS/TRAYS/PACK) ×1 IMPLANT
SET TUBE SMOKE EVAC HIGH FLOW (TUBING) ×2 IMPLANT
SHEET LAVH (DRAPES) ×2 IMPLANT
SLING LYNX SUPRAPUBIC (Sling) ×1 IMPLANT
SOL PREP POV-IOD 4OZ 10% (MISCELLANEOUS) IMPLANT
SOLUTION ELECTROLUBE (MISCELLANEOUS) ×2 IMPLANT
SPONGE SURGIFOAM ABS GEL 12-7 (HEMOSTASIS) ×2 IMPLANT
SURGILUBE 2OZ TUBE FLIPTOP (MISCELLANEOUS) IMPLANT
SUT MNCRL AB 4-0 PS2 18 (SUTURE) ×4 IMPLANT
SUT PROLENE 2 0 CT 1 (SUTURE) ×2 IMPLANT
SUT VIC AB 0 CT1 27 (SUTURE) ×2
SUT VIC AB 0 CT1 27XBRD ANTBC (SUTURE) ×1 IMPLANT
SUT VIC AB 2-0 SH 27 (SUTURE) ×10
SUT VIC AB 2-0 SH 27XBRD (SUTURE) ×5 IMPLANT
SUT VIC AB 3-0 SH 27 (SUTURE) ×2
SUT VIC AB 3-0 SH 27X BRD (SUTURE) IMPLANT
SUT VICRYL 0 UR6 27IN ABS (SUTURE) ×2 IMPLANT
SUT VLOC 180 3-0 9IN GS21 (SUTURE) IMPLANT
SYR 50ML LL SCALE MARK (SYRINGE) IMPLANT
SYR BULB IRRIG 60ML STRL (SYRINGE) IMPLANT
SYS BAG RETRIEVAL 10MM (BASKET)
SYSTEM BAG RETRIEVAL 10MM (BASKET) IMPLANT
TOWEL OR 17X26 10 PK STRL BLUE (TOWEL DISPOSABLE) ×2 IMPLANT
TRAY LAPAROSCOPIC (CUSTOM PROCEDURE TRAY) ×2 IMPLANT
TROCAR ADV FIXATION 12X100MM (TROCAR) ×2 IMPLANT
TROCAR Z-THREAD FIOS 5X100MM (TROCAR) ×2 IMPLANT
WATER STERILE IRR 1000ML POUR (IV SOLUTION) ×2 IMPLANT
YANKAUER SUCT BULB TIP 10FT TU (MISCELLANEOUS) ×1 IMPLANT
YANKAUER SUCT BULB TIP NO VENT (SUCTIONS) ×1 IMPLANT

## 2022-05-29 NOTE — Op Note (Signed)
Operative Note  Preoperative diagnosis:  1.  Stress urinary incontinence  Postoperative diagnosis: 1.  Stress urinary incontinence  Procedure(s): 1.  Midurethral sling [facility] [employer] sling)  Surgeon: Kasandra Knudsen, MD  Assistants:  None  Anesthesia:  General  Complications:  None  EBL:  25 mL  Specimens: 1. none  Drains/Catheters: 1.  16Fr foley  Intraoperative findings:   Patulous urethral meatus Bilateral orthotopics Uos effluxing clear urine No evidence of trocars in bladder   Indication:  Andrea Mendoza is a 66 y.o. female with pelvic organ prolapse and SUI on urodynamics.    Description of procedure: After risks, benefits alternatives of the procedure were discussed with the patient informed consent was obtained.  The patient was already in the operating room for robotic sacrocolpopexy.  After the robotic Secrelux was completed attention turned to the mid urethral sling.  A 16 French Foley catheter was then placed in the patient's urethra and the bladder was drained. The Foley catheter was then capped.  The suprapubic incisions were then marked out 1 cm lateral to midline and 1 cm above the pubic bone. Using a 15 blade a stab incision was made in both sides of midline. Gauze is placed over these areas to control the skin bleeding. A 1.5 cm incision was then made in the mid urethra through the vaginal mucosa. Using this Lee scissors dissection was then carried out. Her urethra we laterally on both sides to the endopelvic fascia. The dissection was completed once there was enough space to place a finger through the incision on both sides of the urethra. Using the [facility] needle set both needles were passed through the stab incisions suprapubically down posterior to the pubic bone and then through the periurethral incisions using the index finger to guide the needle out of the incision. Once both needles had been placed the Foley catheter was removed and  cystoscopy was performed. A 70 lens was passed gently through the patient's urethra and into the bladder under visual guidance. A 360 cystoscopic evaluation was performed. There was no mucosal abnormalities or evidence of perforation from the needles. The cystoscope was then removed and the Foley catheter replaced. The bladder was then drained again and the Foley catheter. The ends of the sling were then attached to the needles and the needles pulled up through the retropubic space and out the suprapubic incision. Once the sling was noted to be centered, the blue plastic cap at the apex of the sling was cut and the plastic sheath was then pulled out. The mesh was noted to be well seated around the urethra. A right angle was used to ensure that the proper amount of tension for the sling around the urethra applied. The sling was noted to be tension free and well positioned. At this point copious amounts of double antibiotic irrigation was then used to irrigate the incision the periurethral space and the vagina. The incision was then closed with 2-0 Vicryl in a running fashion. The stab incisions in the suprapubic area were closed with Dermabond. The vagina was then packed with clindamycin impregnated vaginal packing. The patient was subsequently extubated and returned to the PACU in stable condition.  Disposition: Vaginal packing and foley will be removed tomorrow.

## 2022-05-29 NOTE — Anesthesia Postprocedure Evaluation (Signed)
Anesthesia Post Note  Patient: FLORANCE PAOLILLO  Procedure(s) Performed: XI ROBOTIC ASSISTED LAPAROSCOPIC SACROCOLPOPEXY     Patient location during evaluation: PACU Anesthesia Type: General Level of consciousness: awake and alert Pain management: pain level controlled Vital Signs Assessment: post-procedure vital signs reviewed and stable Respiratory status: spontaneous breathing, nonlabored ventilation, respiratory function stable and patient connected to nasal cannula oxygen Cardiovascular status: blood pressure returned to baseline and stable Postop Assessment: no apparent nausea or vomiting Anesthetic complications: no   No notable events documented.  Last Vitals:  Vitals:   05/29/22 1700 05/29/22 1715  BP: 122/74 120/70  Pulse: 76 74  Resp: 12 13  Temp:  (!) 36.1 C  SpO2: 99% 100%    Last Pain:  Vitals:   05/29/22 1715  TempSrc:   PainSc: Asleep                 Yakub Lodes

## 2022-05-29 NOTE — Anesthesia Procedure Notes (Signed)
Procedure Name: Intubation Date/Time: 05/29/2022 12:03 PM  Performed by: Gean Maidens, CRNAPre-anesthesia Checklist: Patient identified, Emergency Drugs available, Suction available, Patient being monitored and Timeout performed Patient Re-evaluated:Patient Re-evaluated prior to induction Oxygen Delivery Method: Circle system utilized Preoxygenation: Pre-oxygenation with 100% oxygen Induction Type: IV induction Ventilation: Mask ventilation without difficulty Laryngoscope Size: Mac and 3 Grade View: Grade I Tube type: Oral Tube size: 7.0 mm Number of attempts: 1 Airway Equipment and Method: Stylet Placement Confirmation: ETT inserted through vocal cords under direct vision, positive ETCO2 and breath sounds checked- equal and bilateral Secured at: 21 cm Tube secured with: Tape Dental Injury: Teeth and Oropharynx as per pre-operative assessment

## 2022-05-29 NOTE — Interval H&P Note (Signed)
History and Physical Interval Note:  05/29/2022 10:39 AM  Andrea Mendoza  has presented today for surgery, with the diagnosis of pelvic prolapse.  The various methods of treatment have been discussed with the patient and family. After consideration of risks, benefits and other options for treatment, the patient has consented to  Procedure(s) with comments: XI ROBOTIC ASSISTED LAPAROSCOPIC SACROCOLPOPEXY (N/A) - 4 HRS NEEDED FOR THE CASE as a surgical intervention.  The patient's history has been reviewed, patient examined, no change in status, stable for surgery.  I have reviewed the patient's chart and labs.  Questions were answered to the patient's satisfaction.     Crist Fat

## 2022-05-29 NOTE — Anesthesia Preprocedure Evaluation (Addendum)
Anesthesia Evaluation  Patient identified by MRN, date of birth, ID band Patient awake    Reviewed: Allergy & Precautions, H&P , NPO status , Patient's Chart, lab work & pertinent test results, reviewed documented beta blocker date and time   Airway Mallampati: I  TM Distance: >3 FB Neck ROM: full    Dental no notable dental hx. (+) Teeth Intact, Dental Advisory Given, Caps   Pulmonary neg pulmonary ROS,    Pulmonary exam normal breath sounds clear to auscultation       Cardiovascular Exercise Tolerance: Good hypertension, Pt. on medications and Pt. on home beta blockers + dysrhythmias Supra Ventricular Tachycardia  Rhythm:regular Rate:Normal  AVNRT (AV nodal re-entry tachycardia)   Neuro/Psych  Headaches, negative psych ROS   GI/Hepatic negative GI ROS, Neg liver ROS,   Endo/Other  negative endocrine ROS  Renal/GU negative Renal ROS  negative genitourinary   Musculoskeletal   Abdominal   Peds  Hematology negative hematology ROS (+)   Anesthesia Other Findings   Reproductive/Obstetrics negative OB ROS                            Anesthesia Physical Anesthesia Plan  ASA: 3  Anesthesia Plan: General   Post-op Pain Management: Ofirmev IV (intra-op)*   Induction:   PONV Risk Score and Plan: 3 and Ondansetron and Dexamethasone  Airway Management Planned: Oral ETT  Additional Equipment: None  Intra-op Plan:   Post-operative Plan: Extubation in OR  Informed Consent: I have reviewed the patients History and Physical, chart, labs and discussed the procedure including the risks, benefits and alternatives for the proposed anesthesia with the patient or authorized representative who has indicated his/her understanding and acceptance.     Dental Advisory Given  Plan Discussed with: CRNA and Anesthesiologist  Anesthesia Plan Comments:         Anesthesia Quick Evaluation

## 2022-05-29 NOTE — Transfer of Care (Signed)
Immediate Anesthesia Transfer of Care Note  Patient: Andrea Mendoza  Procedure(s) Performed: XI ROBOTIC ASSISTED LAPAROSCOPIC SACROCOLPOPEXY  Patient Location: PACU  Anesthesia Type:General  Level of Consciousness: sedated, patient cooperative and responds to stimulation  Airway & Oxygen Therapy: Patient Spontanous Breathing and Patient connected to face mask oxygen  Post-op Assessment: Report given to RN and Post -op Vital signs reviewed and stable  Post vital signs: Reviewed and stable  Last Vitals:  Vitals Value Taken Time  BP 138/79 05/29/22 1602  Temp    Pulse 84 05/29/22 1606  Resp 14 05/29/22 1606  SpO2 98 % 05/29/22 1606  Vitals shown include unvalidated device data.  Last Pain:  Vitals:   05/29/22 1017  TempSrc:   PainSc: 0-No pain         Complications: No notable events documented.

## 2022-05-29 NOTE — H&P (Signed)
cc: Pelvic organ prolapse   03/07/2022: 66 year old woman who is an established patient of Dr. Berneice Heinrich and followed for microhematuria and right renal AML now here for evaluation of pelvic organ prolapse. She is scheduled for CT of the abdomen to evaluate interval change in AML next month. She noticed a bulge in her vaginal area about a month ago. She went to her PCP and confirm that she had pelvic organ prolapse. She feels like it reduces at nighttime. Towards the end of the day she does feel her stream is weaker due to the prolapse. She denies any type of incontinence. She has a history of a hysterectomy. She is very bothered by this and does not wish to have conservative treatment.   Overactive bladder validated question screener: 0, 0, 0, 0, 1, 0, 0, 0=1/40   Interval: Today the patient is here for follow-up and consultation regarding robotic sacrocolpopexy. She has seen Dr. Arita Miss for this previously. She has gone through the operation. She has been scheduled for July 27.   The patient states that her prolapse has continued to worsen and is quite a bit more uncomfortable. She continues to have trouble at the end of the day with a weak stream and discomfort. She has attempted to push the prolapse back with her fingers to help facilitate voiding. She does feel like she is able to empty her bladder completely. She denies any history of urinary incontinence, stress associated or urge associated.   The patient had a cardiac ablation for SVT, she does not take any anticoagulant for that. She also had a hysterectomy. She has not had any other surgeries. She is otherwise healthy. She has great exercise tolerance.   The patient has a history of an AML which has been followed and is stable. No plans for surgical excision or embolization at this time.   The patient is currently married and has lived in [place] for a while, but is moving to [place] this summer right before her surgery.     ALLERGIES: No  Allergies    MEDICATIONS: Aspirin  Metoprolol Tartrate PRN  Bi-Est  Calcium  Ezetimibe  Progesterone  Prolia  Rosuvastatin Calcium 40 mg tablet  Testosterone  Vitamin D3     GU PSH: Hysterectomy Unilat SO - 2009 Locm 300-399Mg /Ml Iodine,1Ml - 04/08/2022       PSH Notes: Hysterectomy   NON-GU PSH: No Non-GU PSH    GU PMH: Cystocele, midline - 04/15/2022, - 03/07/2022 Microscopic hematuria - 04/15/2022, - 01/31/2021 Renal calculus - 04/15/2022, - 01/31/2021 Renal cyst - 04/15/2022, - 01/31/2021 History of urolithiasis, Nephrolithiasis - 2014 Stress Incontinence, Female stress incontinence - 2014 Ureteral calculus, Ureteral Stone - 2014      PMH Notes:  1898-11-03 00:00:00 - Note: Normal Routine History And Physical Adult  2013-08-27 05:45:59 - Note: Renal angiomyolipoma, unspecified laterality   NON-GU PMH: Benign lipomatous neoplasm of kidney - 04/15/2022, - 04/08/2022, - 01/31/2021 Hypercholesterolemia    FAMILY HISTORY: Agammaglobulinemia - Runs In Family Family Health Status - Father alive at age 63 - Father Family Health Status - Mother's Age - Runs In Family Family Health Status Number - Runs In Family Heart Disease - Father    Notes: h/o ks   SOCIAL HISTORY: No Social History     Notes: Caffeine Use, Marital History - Currently Married, Occupation:, Tobacco Use, Alcohol Use   REVIEW OF SYSTEMS:    GU Review Female:   Patient denies frequent urination, hard to postpone urination, burning /pain  with urination, get up at night to urinate, leakage of urine, stream starts and stops, trouble starting your stream, have to strain to urinate, and being pregnant.  Gastrointestinal (Upper):   Patient denies nausea, vomiting, and indigestion/ heartburn.  Gastrointestinal (Lower):   Patient denies diarrhea and constipation.  Constitutional:   Patient denies night sweats, weight loss, fever, and fatigue.  Skin:   Patient denies skin rash/ lesion and itching.  Eyes:   Patient denies  blurred vision and double vision.  Ears/ Nose/ Throat:   Patient denies sore throat and sinus problems.  Hematologic/Lymphatic:   Patient denies swollen glands and easy bruising.  Cardiovascular:   Patient denies leg swelling and chest pains.  Respiratory:   Patient denies cough and shortness of breath.  Endocrine:   Patient denies excessive thirst.  Musculoskeletal:   Patient denies back pain and joint pain.  Neurological:   Patient denies headaches and dizziness.  Psychologic:   Patient denies depression and anxiety.   VITAL SIGNS:      04/22/2022 11:58 AM  Weight 150 lb / 68.04 kg  Height 66 in / 167.64 cm  BP 108/63 mmHg  Pulse 63 /min  Temperature 97.1 F / 36.1 C  BMI 24.2 kg/m   MULTI-SYSTEM PHYSICAL EXAMINATION:    Constitutional: Well-nourished. No physical deformities. Normally developed. Good grooming.  Respiratory: Normal breath sounds. No labored breathing, no use of accessory muscles.   Cardiovascular: Regular rate and rhythm. No murmur, no gallop. Normal temperature, normal extremity pulses, no swelling, no varicosities.      Complexity of Data:  Source Of History:  Patient  Records Review:   Previous Doctor Records, Previous Patient Records, POC Tool  Urine Test Review:   Urinalysis  X-Ray Review: C.T. Abdomen/Pelvis: Reviewed Films. Discussed With Patient.     PROCEDURES: None   ASSESSMENT:      ICD-10 Details  1 GU:   Cystocele, midline - N81.11   2   Stress Incontinence - N39.3    PLAN:           Schedule Return Visit/Planned Activity: ASAP - Urodynamics          Document Letter(s):  Created for Patient: Clinical Summary         Notes:   I went over robotic-assisted laparoscopic sacral colpopexy with the patient detail. I explained to the patient the rationale for the surgery. I also went over the placement of the laparoscopic ports. I detailed to her the surgery as well as the postoperative recovery time. I explained to the patient that she could  expect to be in the hospital at least one or 2 nights. She will require 4 weeks of no heavy lifting, 6 weeks of no bending or twisting. She will not be able to use her vagina for 6 weeks. I discussed complications of the operation including injury to bowel, ureters, bladder. We also discussed the risk of failure as well as the complications of mesh. I explained to them the difference between transvaginal mesh and the mesh used for sacral colpopexy. I reassured them that there has not been an FDA warnings in regards to sacral colpopexy mesh. We will plan to get this prior to her surgery. I spent 45 minutes with the patient going over that ins and outs of the surgery and answering all her questions.   The patient will need urodynamics prior to evaluate for stress incontinence with her prolapse reduced. I went through the reason for this. She is in  agreement to proceed. I will contact her with the results. If need be, we will add mid urethral sling to her consent form.

## 2022-05-29 NOTE — Interval H&P Note (Signed)
History and Physical Interval Note: After discussion with the patient regarding her urodynamic results and your past history.  We have opted to proceed with a mid urethral sling as well.  I discussed the risk and the benefits of this operation as well and the patient is opted to proceed  05/29/2022 11:28 AM  Andrea Mendoza  has presented today for surgery, with the diagnosis of pelvic prolapse.  The various methods of treatment have been discussed with the patient and family. After consideration of risks, benefits and other options for treatment, the patient has consented to  Procedure(s) with comments: XI ROBOTIC ASSISTED LAPAROSCOPIC SACROCOLPOPEXY (N/A) - 4 HRS NEEDED FOR THE CASE as a surgical intervention.  The patient's history has been reviewed, patient examined, no change in status, stable for surgery.  I have reviewed the patient's chart and labs.  Questions were answered to the patient's satisfaction.     Crist Fat

## 2022-05-29 NOTE — Discharge Instructions (Signed)

## 2022-05-30 ENCOUNTER — Encounter (HOSPITAL_COMMUNITY): Payer: Self-pay | Admitting: Urology

## 2022-05-30 DIAGNOSIS — N393 Stress incontinence (female) (male): Secondary | ICD-10-CM | POA: Diagnosis not present

## 2022-05-30 LAB — BASIC METABOLIC PANEL
Anion gap: 9 (ref 5–15)
BUN: 17 mg/dL (ref 8–23)
CO2: 21 mmol/L — ABNORMAL LOW (ref 22–32)
Calcium: 7.5 mg/dL — ABNORMAL LOW (ref 8.9–10.3)
Chloride: 101 mmol/L (ref 98–111)
Creatinine, Ser: 0.98 mg/dL (ref 0.44–1.00)
GFR, Estimated: >60 mL/min (ref >60)
Glucose, Bld: 97 mg/dL (ref 70–99)
Potassium: 3.4 mmol/L — ABNORMAL LOW (ref 3.5–5.1)
Sodium: 131 mmol/L — ABNORMAL LOW (ref 135–145)

## 2022-05-30 LAB — CBC
HCT: 33.9 % — ABNORMAL LOW (ref 36.0–46.0)
Hemoglobin: 11.6 g/dL — ABNORMAL LOW (ref 12.0–15.0)
MCH: 31.3 pg (ref 26.0–34.0)
MCHC: 34.2 g/dL (ref 30.0–36.0)
MCV: 91.4 fL (ref 80.0–100.0)
Platelets: 179 10*3/uL (ref 150–400)
RBC: 3.71 MIL/uL — ABNORMAL LOW (ref 3.87–5.11)
RDW: 12.8 % (ref 11.5–15.5)
WBC: 12.3 10*3/uL — ABNORMAL HIGH (ref 4.0–10.5)
nRBC: 0 % (ref 0.0–0.2)

## 2022-05-30 MED ORDER — DOCUSATE SODIUM 100 MG PO CAPS: 100.0000 mg | ORAL_CAPSULE | Freq: Two times a day (BID) | ORAL | 0 refills | Status: DC | PRN

## 2022-05-30 NOTE — Progress Notes (Signed)
Urology Inpatient Progress Report  Prolapse of female pelvic organs [N81.9]  Procedure(s): XI ROBOTIC ASSISTED LAPAROSCOPIC SACROCOLPOPEXY  1 Day Post-Op   Intv/Subj: No acute events overnight. Patient is without complaint.  Slept well Sore Tolerating regular diet  Principal Problem:   Prolapse of female pelvic organs  Current Facility-Administered Medications  Medication Dose Route Frequency Provider Last Rate Last Admin   0.45 % sodium chloride infusion   Intravenous Continuous Crist Fat, MD       0.45 % sodium chloride infusion   Intravenous Continuous Crist Fat, MD 100 mL/hr at 05/29/22 2023 New Bag at 05/29/22 2023   acetaminophen (OFIRMEV) IV 1,000 mg  1,000 mg Intravenous Q6H Crist Fat, MD 400 mL/hr at 05/30/22 0030 1,000 mg at 05/30/22 0030   ceFAZolin (ANCEF) IVPB 1 g/50 mL premix  1 g Intravenous Q8H Crist Fat, MD 100 mL/hr at 05/30/22 0332 1 g at 05/30/22 0332   hydrALAZINE (APRESOLINE) injection 5 mg  5 mg Intravenous Q4H PRN Crist Fat, MD       HYDROmorphone (DILAUDID) injection 0.5 mg  0.5 mg Intravenous Q2H PRN Crist Fat, MD       ketorolac (TORADOL) 15 MG/ML injection 15 mg  15 mg Intravenous Q6H Crist Fat, MD   15 mg at 05/30/22 0606   ondansetron (ZOFRAN) injection 4 mg  4 mg Intravenous Q6H PRN Troy Sine, MD   4 mg at 05/29/22 2118   traMADol (ULTRAM) tablet 50-100 mg  50-100 mg Oral Q6H PRN Crist Fat, MD   100 mg at 05/30/22 0614     Objective: Vital: Vitals:   05/29/22 1844 05/29/22 2233 05/30/22 0245 05/30/22 0654  BP: 124/75 111/66 121/69 116/74  Pulse: 71 84 95 85  Resp: 18 16 18 18   Temp:  97.9 F (36.6 C) 98.6 F (37 C) 98.2 F (36.8 C)  TempSrc:  Oral Oral Oral  SpO2: 94% 98% 95% 96%   I/Os: I/O last 3 completed shifts: In: 2100 [I.V.:2000; IV Piggyback:100] Out: 500 [Urine:400; Blood:100]  Physical Exam:  General: Patient is in no apparent  distress Lungs: Normal respiratory effort, chest expands symmetrically. GI: Incisions are c/d/i.  Foley: out  Ext: lower extremities symmetric  Lab Results: No results for input(s): "WBC", "HGB", "HCT" in the last 72 hours. No results for input(s): "NA", "K", "CL", "CO2", "GLUCOSE", "BUN", "CREATININE", "CALCIUM" in the last 72 hours. No results for input(s): "LABPT", "INR" in the last 72 hours. No results for input(s): "LABURIN" in the last 72 hours. No results found for this or any previous visit.  Studies/Results: DG Wrist Complete Right  Result Date: 05/29/2022 CLINICAL DATA:  Recent RIGHT wrist fracture diagnosed at outside facility. EXAM: RIGHT WRIST - COMPLETE 3 VIEW COMPARISON:  None Available. FINDINGS: Overlying fiberglass cast/splint obscures fine detail. A mildly comminuted impacted fracture of the distal radial metaphysis is noted with mild apex anterior angulation. Equivocal intra-articular extension on this study. A small ulnar styloid fracture is noted. There is no evidence of subluxation or dislocation. No other acute abnormalities are noted. IMPRESSION: 1. Mildly comminuted impacted fracture of the distal radial metaphysis with mild apex anterior angulation. Equivocal intra-articular extension. Small ulnar styloid fracture. Electronically Signed   By: 05/31/2022 M.D.   On: 05/29/2022 10:25    Assessment: Procedure(s): XI ROBOTIC ASSISTED LAPAROSCOPIC SACROCOLPOPEXY, 1 Day Post-Op  doing well.  Plan: Ambulate HLIVF Needs to void Dr. 05/31/2022 will see prior to discharge for  consult re: right wrist D/C home before lunch.   Berniece Salines, MD Urology 05/30/2022, 7:45 AM

## 2022-05-30 NOTE — Discharge Summary (Addendum)
Date of admission: 05/29/2022  Date of discharge: 05/31/2022  Admission diagnosis: pelvic organ prolapse, stress urinary incontinence  Discharge diagnosis: same  Secondary diagnoses:  Patient Active Problem List   Diagnosis Date Noted   Prolapse of female pelvic organs 05/29/2022   Essential hypertension 04/19/2020   Dyslipidemia 04/23/2018   AVNRT (AV nodal re-entry tachycardia) (HCC) 04/23/2018   Elevated coronary artery calcium score 04/23/2018   SOB (shortness of breath) 09/29/2017   SVT (supraventricular tachycardia) (HCC) 09/20/2015    Procedures performed: Procedure(s): XI ROBOTIC ASSISTED LAPAROSCOPIC SACROCOLPOPEXY  History and Physical: For full details, please see admission history and physical. Briefly, Andrea Mendoza is a 66 y.o. year old patient with pelvic organ prolapse and stress urinary incontinence.   Hospital Course: Patient tolerated the procedure well.  She was then transferred to the floor after an uneventful PACU stay.  Her hospital course was uncomplicated.  She stayed on POD#1 because of low urine output.  On POD#2 she had met discharge criteria: was eating a regular diet, was up and ambulating independently,  pain was well controlled, was voiding without a catheter, and was ready to for discharge.  Exam on day of discharge: NAD Vitals:   05/30/22 1302 05/30/22 1631 05/30/22 2123 05/31/22 0640  BP: (!) 143/73  (!) 154/87 120/70  Pulse: 80  91 91  Resp: 18  18 18   Temp: 97.7 F (36.5 C) 98.7 F (37.1 C) 97.9 F (36.6 C) 97.8 F (36.6 C)  TempSrc: Oral Oral Oral Oral  SpO2: 99%  98% 100%    Intake/Output Summary (Last 24 hours) at 05/31/2022 0818 Last data filed at 05/31/2022 0730 Gross per 24 hour  Intake 1898.27 ml  Output 2450 ml  Net -551.73 ml   Non-labored breathing Casted right wrist Abdomen is soft, incisions c/d/I, appropriately tender Extremities symmetric   Laboratory values:  Recent Labs    05/30/22 1755 05/31/22 0525  WBC  12.3* 9.0  HGB 11.6* 9.0*  HCT 33.9* 26.8*   Recent Labs    05/30/22 1755 05/31/22 0525  NA 131* 136  K 3.4* 3.6  CL 101 109  CO2 21* 22  GLUCOSE 97 103*  BUN 17 11  CREATININE 0.98 0.71  CALCIUM 7.5* 7.2*   No results for input(s): "LABPT", "INR" in the last 72 hours. No results for input(s): "LABURIN" in the last 72 hours. No results found for this or any previous visit.  Disposition: Home  Discharge instruction: The patient was instructed to be ambulatory but told to refrain from heavy lifting, strenuous activity, or driving.   Discharge medications:  Allergies as of 05/31/2022       Reactions   Alendronate Sodium    Finger arthralgia   Meloxicam    bruising        Medication List     TAKE these medications    Aspirin 81 MG Caps Take 81 mg by mouth every other day.   Biest/Progesterone Crea Place 1 application  onto the skin every Monday, Tuesday, Wednesday, Thursday, and Friday. 0.2(0.16-0.04) mg/ml   CALCIUM + D3 PO Take 1 tablet by mouth daily.   denosumab 60 MG/ML Sosy injection Commonly known as: PROLIA Inject 60 mg into the skin every 6 (six) months.   Dialyvite Vitamin D 5000 125 MCG (5000 UT) capsule Generic drug: Cholecalciferol Take 5,000 Units by mouth daily.   docusate sodium 100 MG capsule Commonly known as: COLACE Take 1 capsule (100 mg total) by mouth 2 (two) times daily  as needed (take to keep stool soft.).   estradiol 0.1 MG/GM vaginal cream Commonly known as: ESTRACE VAGINAL Place 1 Applicatorful vaginally 3 (three) times a week. Use 1 small dolyp of cream on tip of index finger and swap the inside of the vagina   ezetimibe 10 MG tablet Commonly known as: ZETIA Take 1 tablet (10 mg total) by mouth daily.   ibuprofen 200 MG tablet Commonly known as: ADVIL Take 200 mg by mouth every 6 (six) hours as needed for moderate pain.   metoprolol tartrate 25 MG tablet Commonly known as: LOPRESSOR TAKE 1/2 (ONE-HALF) TABLET BY MOUTH  AS NEEDED   PROGESTERONE PO Take 1 capsule by mouth See admin instructions. 150 mg progesterone and 5 mg dhea. Take 1 capsule Monday through Saturday, skip dose Sundays   rosuvastatin 40 MG tablet Commonly known as: CRESTOR Take 1 tablet by mouth once daily   sulfamethoxazole-trimethoprim 800-160 MG tablet Commonly known as: BACTRIM DS Take 1 tablet by mouth 2 (two) times daily. Start taking one day prior to your appointment for your first follow-up and catheter removal.  Continue taking for three days.   TESTOSTERONE COMPOUNDING KIT TD Place 1 application  onto the skin See admin instructions. 0.02%  0.2mg  /ml apply topically daily except on sundays   traMADol 50 MG tablet Commonly known as: Ultram Take 1-2 tablets (50-100 mg total) by mouth every 6 (six) hours as needed for moderate pain.        Followup:   Follow-up Information     Vanessa Barbara, NP Follow up on 06/12/2022.   Why: 10:45am, For wound re-check Contact information: [address] [place] [address] [phone]         Jodi Geralds, MD Follow up.   Specialty: Orthopedic Surgery Why: Wrist fracture repair next week Contact information: [address] [place] [address] [phone]

## 2022-05-30 NOTE — Progress Notes (Signed)
  Transition of Care [facility]) Screening Note   Patient Details  Name: Andrea Mendoza Date of Birth: 08-30-1956   Transition of Care [facility]) CM/SW Contact:    Lanier Clam, RN Phone Number: 05/30/2022, 11:10 AM    Transition of Care Department [facility]) has reviewed patient and no TOC needs have been identified at this time. We will continue to monitor patient advancement through interdisciplinary progression rounds. If new patient transition needs arise, please place a TOC consult.

## 2022-05-31 DIAGNOSIS — N393 Stress incontinence (female) (male): Secondary | ICD-10-CM | POA: Diagnosis not present

## 2022-05-31 LAB — BASIC METABOLIC PANEL
Anion gap: 5 (ref 5–15)
BUN: 11 mg/dL (ref 8–23)
CO2: 22 mmol/L (ref 22–32)
Calcium: 7.2 mg/dL — ABNORMAL LOW (ref 8.9–10.3)
Chloride: 109 mmol/L (ref 98–111)
Creatinine, Ser: 0.71 mg/dL (ref 0.44–1.00)
GFR, Estimated: >60 mL/min (ref >60)
Glucose, Bld: 103 mg/dL — ABNORMAL HIGH (ref 70–99)
Potassium: 3.6 mmol/L (ref 3.5–5.1)
Sodium: 136 mmol/L (ref 135–145)

## 2022-05-31 LAB — CBC
HCT: 26.8 % — ABNORMAL LOW (ref 36.0–46.0)
Hemoglobin: 9 g/dL — ABNORMAL LOW (ref 12.0–15.0)
MCH: 31.1 pg (ref 26.0–34.0)
MCHC: 33.6 g/dL (ref 30.0–36.0)
MCV: 92.7 fL (ref 80.0–100.0)
Platelets: 183 10*3/uL (ref 150–400)
RBC: 2.89 MIL/uL — ABNORMAL LOW (ref 3.87–5.11)
RDW: 12.9 % (ref 11.5–15.5)
WBC: 9 10*3/uL (ref 4.0–10.5)
nRBC: 0 % (ref 0.0–0.2)

## 2022-05-31 NOTE — Plan of Care (Signed)

## 2022-06-04 ENCOUNTER — Ambulatory Visit: Payer: Medicare Other | Admitting: Internal Medicine

## 2022-06-25 ENCOUNTER — Other Ambulatory Visit: Payer: Self-pay | Admitting: Cardiology

## 2022-06-25 ENCOUNTER — Ambulatory Visit: Payer: Medicare Other | Admitting: Cardiology

## 2022-08-04 ENCOUNTER — Ambulatory Visit: Payer: Medicare Other | Admitting: Cardiology

## 2022-09-04 ENCOUNTER — Other Ambulatory Visit: Payer: Self-pay | Admitting: Cardiology

## 2022-09-06 NOTE — Progress Notes (Unsigned)
Cardiology Office Note   Date:  09/09/2022   ID:  Andrea Mendoza, DOB January 05, 1956, MRN [id number]  PCP:  Orpah Melter, MD  Cardiologist:   Minus Breeding, MD   Chief Complaint  Patient presents with   Elevated coronary calcium     History of Present Illness: Andrea Mendoza is a 66 y.o. female who presents for evaluation of SVT and coronary calcium.  She had a negative POET (Plain Old Exercise Treadmill) in July 2021.  Since I last saw her cystocele surgery and a broken wrist and she moved to [place].  There is been a lot going on in the last few months.  With all of the stress and physical pain she has had no cardiovascular symptoms. The patient denies any new symptoms such as chest discomfort, neck or arm discomfort. There has been no new shortness of breath, PND or orthopnea. There have been no reported palpitations, presyncope or syncope.     Past Medical History:  Diagnosis Date   Abnormal vaginal Pap smear    Dysrhythmia    tachycardia   History of fibrocystic disease of breast    Hx of migraines    Kidney stones    Osteopenia    Post menopausal problems    Transient hypertension     Past Surgical History:  Procedure Laterality Date   BREAST BIOPSY Right    ROBOTIC ASSISTED LAPAROSCOPIC SACROCOLPOPEXY N/A 05/29/2022   Procedure: XI ROBOTIC ASSISTED LAPAROSCOPIC SACROCOLPOPEXY;  Surgeon: Ardis Hughs, MD;  Location: [facility];  Service: Urology;  Laterality: N/A;  midurethral sling and cystoscopy   SVT ABLATION N/A 01/05/2019   Procedure: SVT ABLATION;  Surgeon: Constance Haw, MD;  Location: [facility];  Service: Cardiovascular;  Laterality: N/A;   VAGINAL HYSTERECTOMY       Current Outpatient Medications  Medication Sig Dispense Refill   Calcium Carb-Cholecalciferol (CALCIUM + D3 PO) Take 1 tablet by mouth daily.     Cholecalciferol (DIALYVITE VITAMIN D 5000) 125 MCG (5000 UT) capsule Take 5,000 Units by mouth daily.     denosumab (PROLIA) 60  MG/ML SOSY injection Inject 60 mg into the skin every 6 (six) months.     estradiol (ESTRACE VAGINAL) 0.1 MG/GM vaginal cream Place 1 Applicatorful vaginally 3 (three) times a week. Use 1 small dolyp of cream on tip of index finger and swap the inside of the vagina 42.5 g 1   Estradiol-Estriol-Progesterone (BIEST/PROGESTERONE) CREA Place 1 application  onto the skin every Monday, Tuesday, Wednesday, Thursday, and Friday. 0.2(0.16-0.04) mg/ml     ezetimibe (ZETIA) 10 MG tablet Take 1 tablet (10 mg total) by mouth daily. 90 tablet 3   metoprolol tartrate (LOPRESSOR) 25 MG tablet TAKE 1/2 (ONE-HALF) TABLET BY MOUTH AS NEEDED 45 tablet 3   Progesterone Micronized (PROGESTERONE PO) Take 1 capsule by mouth See admin instructions. 150 mg progesterone and 5 mg dhea. Take 1 capsule Monday through Saturday, skip dose Sundays     rosuvastatin (CRESTOR) 40 MG tablet Take 1 tablet by mouth once daily 90 tablet 0   TESTOSTERONE COMPOUNDING KIT TD Place 1 application  onto the skin See admin instructions. 0.02%  0.63m /ml apply topically daily except on sundays     Aspirin 81 MG CAPS Take 81 mg by mouth every other day. (Patient not taking: Reported on 09/09/2022)     docusate sodium (COLACE) 100 MG capsule Take 1 capsule (100 mg total) by mouth 2 (two) times daily as  needed (take to keep stool soft.). (Patient not taking: Reported on 09/09/2022) 60 capsule 0   ibuprofen (ADVIL) 200 MG tablet Take 200 mg by mouth every 6 (six) hours as needed for moderate pain. (Patient not taking: Reported on 09/09/2022)     sulfamethoxazole-trimethoprim (BACTRIM DS) 800-160 MG tablet Take 1 tablet by mouth 2 (two) times daily. Start taking one day prior to your appointment for your first follow-up and catheter removal.  Continue taking for three days. (Patient not taking: Reported on 09/09/2022) 6 tablet 0   traMADol (ULTRAM) 50 MG tablet Take 1-2 tablets (50-100 mg total) by mouth every 6 (six) hours as needed for moderate pain.  (Patient not taking: Reported on 09/09/2022) 15 tablet 0   No current facility-administered medications for this visit.    Allergies:   Alendronate sodium and Meloxicam     ROS:  Please see the history of present illness.   Otherwise, review of systems are positive for none.   All other systems are reviewed and negative.    PHYSICAL EXAM: VS:  BP 124/86 (BP Location: Left Arm, Patient Position: Sitting, Cuff Size: Normal)   Pulse 89   Ht _0  (1.676 m)   Wt 154 lb 6.4 oz (70 kg)   SpO2 97%   BMI 24.92 kg/m  , BMI Body mass index is 24.92 kg/m. GENERAL:  Well appearing NECK:  No jugular venous distention, waveform within normal limits, carotid upstroke brisk and symmetric, no bruits, no thyromegaly LUNGS:  Clear to auscultation bilaterally CHEST:  Unremarkable HEART:  PMI not displaced or sustained,S1 and S2 within normal limits, no S3, no S4, no clicks, no rubs, no murmurs ABD:  Flat, positive bowel sounds normal in frequency in pitch, no bruits, no rebound, no guarding, no midline pulsatile mass, no hepatomegaly, no splenomegaly EXT:  2 plus pulses throughout, no edema, no cyanosis no clubbing   EKG:  EKG is not ordered today. The ekg ordered 05/16/2022 demonstrates sinus rhythm, rate 73, axis normal limits, intervals within normal limits, no acute ST-T wave changes.   Recent Labs: 10/23/2021: ALT 51 05/31/2022: BUN 11; Creatinine, Ser 0.71; Hemoglobin 9.0; Platelets 183; Potassium 3.6; Sodium 136    Lipid Panel    Component Value Date/Time   CHOL 127 02/26/2022 0911   TRIG 74 02/26/2022 0911   HDL 56 02/26/2022 0911   CHOLHDL 2.3 02/26/2022 0911   LDLCALC 56 02/26/2022 0911      Wt Readings from Last 3 Encounters:  09/09/22 154 lb 6.4 oz (70 kg)  05/16/22 150 lb (68 kg)  01/01/22 152 lb 12.8 oz (69.3 kg)      Other studies Reviewed: Additional studies/ records that were reviewed today include: Labs. Review of the above records demonstrates:  Please see  elsewhere in the note.     ASSESSMENT AND PLAN:  AVNRT:   She has had no symptomatic recurrence.  No change in therapy.   DYSLIPIDEMIA:   LDL is 50 and HDL is 50.  No change in therapy.   ELEVATED CORONARY CALCIUM SCORE:   She had absolutely no symptoms despite the stress she has been under.  I do not think further cardiovascular testing is suggested.  We talked about primary risk reduction.  We talked about paying attention to symptoms.  HTN:    Her blood pressure was at target.  No change in therapy.   Current medicines are reviewed at length with the patient today.  The patient does not have concerns regarding  medicines.  The following changes have been made: None  Labs/ tests ordered today include: None  No orders of the defined types were placed in this encounter.     Disposition:   FU with in 24 months.  (She moved to [place] and will let me know if she finds a new physician we will take her off the list. )    Signed, Minus Breeding, MD  09/09/2022 2:14 PM    [place]

## 2022-09-09 ENCOUNTER — Ambulatory Visit: Payer: Medicare Other | Attending: Cardiology | Admitting: Cardiology

## 2022-09-09 ENCOUNTER — Encounter: Payer: Self-pay | Admitting: Cardiology

## 2022-09-09 VITALS — BP 124/86 | HR 89 | Ht 66.0 in | Wt 154.4 lb

## 2022-09-09 DIAGNOSIS — I10 Essential (primary) hypertension: Secondary | ICD-10-CM | POA: Insufficient documentation

## 2022-09-09 DIAGNOSIS — R931 Abnormal findings on diagnostic imaging of heart and coronary circulation: Secondary | ICD-10-CM | POA: Insufficient documentation

## 2022-09-09 DIAGNOSIS — I4719 Other supraventricular tachycardia: Secondary | ICD-10-CM | POA: Diagnosis not present

## 2022-09-09 DIAGNOSIS — E785 Hyperlipidemia, unspecified: Secondary | ICD-10-CM | POA: Insufficient documentation

## 2022-09-09 NOTE — Patient Instructions (Signed)
Medication Instructions:  Your physician recommends that you continue on your current medications as directed. Please refer to the Current Medication list given to you today.  *If you need a refill on your cardiac medications before your next appointment, please call your pharmacy* Follow-Up: At [facility], you and your health needs are our priority.  As part of our continuing mission to provide you with exceptional heart care, we have created designated Provider Care Teams.  These Care Teams include your primary Cardiologist (physician) and Advanced Practice Providers (APPs -  Physician Assistants and Nurse Practitioners) who all work together to provide you with the care you need, when you need it.  We recommend signing up for the patient portal called "MyChart".  Sign up information is provided on this After Visit Summary.  MyChart is used to connect with patients for Virtual Visits (Telemedicine).  Patients are able to view lab/test results, encounter notes, upcoming appointments, etc.  Non-urgent messages can be sent to your provider as well.   To learn more about what you can do with MyChart, go to [email or web address].    Your next appointment:   2 year(s)  The format for your next appointment:   In Person  Provider:   Minus Breeding, MD

## 2022-09-12 ENCOUNTER — Other Ambulatory Visit: Payer: Self-pay | Admitting: Cardiology

## 2022-09-27 ENCOUNTER — Other Ambulatory Visit: Payer: Self-pay | Admitting: Cardiology

## 2022-10-03 ENCOUNTER — Telehealth: Payer: Self-pay | Admitting: Cardiology

## 2022-10-03 DIAGNOSIS — E785 Hyperlipidemia, unspecified: Secondary | ICD-10-CM

## 2022-10-03 MED ORDER — EZETIMIBE 10 MG PO TABS: 10.0000 mg | ORAL_TABLET | Freq: Every day | ORAL | 3 refills | Status: DC

## 2022-10-03 NOTE — Telephone Encounter (Signed)
*  STAT* If patient is at the pharmacy, call can be transferred to refill team.   1. Which medications need to be refilled? (please list name of each medication and dose if known) ezetimibe (ZETIA) 10 MG tablet   2. Which pharmacy/location (including street and city if local pharmacy) is medication to be sent to? [facility] - [place], [place] - [address]   3. Do they need a 30 day or 90 day supply? 90 day  Patient is completely out of medication

## 2022-10-13 ENCOUNTER — Ambulatory Visit: Payer: Medicare Other | Admitting: Internal Medicine

## 2022-12-15 ENCOUNTER — Telehealth: Payer: Self-pay | Admitting: Cardiology

## 2022-12-15 NOTE — Telephone Encounter (Signed)
Spoke to the patient, she was very pleased that Dr. Percival Spanish recommend an cardiologist.

## 2022-12-15 NOTE — Telephone Encounter (Signed)
  Pt is calling, Andrea Mendoza requesting if Dr. Percival Spanish can recommend a cardiologist in [place]. Andrea said, her husband need to see Cardiologist due to significant amount of plaque build up in his heart. Andrea said, they don't need a referral just a name and practice of a cardiologist in [place]

## 2023-02-10 ENCOUNTER — Other Ambulatory Visit: Payer: Self-pay | Admitting: Family Medicine

## 2023-02-10 DIAGNOSIS — Z1231 Encounter for screening mammogram for malignant neoplasm of breast: Secondary | ICD-10-CM

## 2023-03-29 ENCOUNTER — Other Ambulatory Visit: Payer: Self-pay | Admitting: Cardiology

## 2023-04-08 ENCOUNTER — Encounter: Payer: Self-pay | Admitting: Internal Medicine

## 2023-04-08 ENCOUNTER — Ambulatory Visit (INDEPENDENT_AMBULATORY_CARE_PROVIDER_SITE_OTHER): Payer: Medicare Other | Admitting: Internal Medicine

## 2023-04-08 VITALS — BP 130/84 | HR 93 | Temp 97.6°F | Ht 66.0 in | Wt 158.8 lb

## 2023-04-08 DIAGNOSIS — L538 Other specified erythematous conditions: Secondary | ICD-10-CM

## 2023-04-08 DIAGNOSIS — E2839 Other primary ovarian failure: Secondary | ICD-10-CM | POA: Diagnosis not present

## 2023-04-08 DIAGNOSIS — N951 Menopausal and female climacteric states: Secondary | ICD-10-CM | POA: Diagnosis not present

## 2023-04-08 DIAGNOSIS — L270 Generalized skin eruption due to drugs and medicaments taken internally: Secondary | ICD-10-CM

## 2023-04-08 DIAGNOSIS — Z79899 Other long term (current) drug therapy: Secondary | ICD-10-CM

## 2023-04-08 DIAGNOSIS — Z6825 Body mass index (BMI) 25.0-25.9, adult: Secondary | ICD-10-CM

## 2023-04-08 NOTE — Progress Notes (Unsigned)
Subjective:  Patient ID: Andrea Mendoza , female    DOB: Apr 23, 1956 , 67 y.o.   MRN: [id number]  Chief Complaint  Patient presents with   BHRT     HPI  She presents today for f/u BHRT. She feels well on her current regimen. She now using Bi-est, progesterone nightly, DHEA and supplemental testosterone.  Since her last visit, she has moved to [place].  She has no new concerns or complaints at this time.   She adds, wanting a second opinion about taking the Prolia injection. Her PCP has prescribed this medication to treat her osteoporosis. She wants to know if she is doing the right thing.  She is aware that she is overdue for colonoscopy.      Past Medical History:  Diagnosis Date   Abnormal vaginal Pap smear    Dysrhythmia    tachycardia   History of fibrocystic disease of breast    Hx of migraines    Kidney stones    Osteopenia    Post menopausal problems    Transient hypertension      Family History  Problem Relation Age of Onset   Breast cancer Mother    Heart attack Mother    Heart attack Father    Dementia Other    CVA Other    CAD Other    Osteoporosis Other    Stroke Other        X 2   Colon polyps Other    CVA Other    CAD Other    Breast cancer Other      Current Outpatient Medications:    Calcium Carb-Cholecalciferol (CALCIUM + D3 PO), Take 1 tablet by mouth daily., Disp: , Rfl:    Cephalexin 500 MG tablet, Take by mouth., Disp: , Rfl:    Cholecalciferol (DIALYVITE VITAMIN D 5000) 125 MCG (5000 UT) capsule, Take 5,000 Units by mouth daily., Disp: , Rfl:    denosumab (PROLIA) 60 MG/ML SOSY injection, Inject 60 mg into the skin every 6 (six) months., Disp: , Rfl:    Estradiol-Estriol-Progesterone (BIEST/PROGESTERONE) CREA, Place 1 application  onto the skin every Monday, Tuesday, Wednesday, Thursday, and Friday. 0.2(0.16-0.04) mg/ml, Disp: , Rfl:    ezetimibe (ZETIA) 10 MG tablet, Take 1 tablet (10 mg total) by mouth daily., Disp: 90 tablet, Rfl: 3    metoprolol tartrate (LOPRESSOR) 25 MG tablet, TAKE 1/2 (ONE-HALF) TABLET BY MOUTH AS NEEDED, Disp: 45 tablet, Rfl: 3   Progesterone Micronized (PROGESTERONE PO), Take 1 capsule by mouth See admin instructions. 150 mg progesterone and 5 mg dhea. Take 1 capsule Monday through Saturday, skip dose Sundays, Disp: , Rfl:    rosuvastatin (CRESTOR) 40 MG tablet, TAKE 1 TABLET BY MOUTH ONCE DAILY . APPOINTMENT REQUIRED FOR FUTURE REFILLS, Disp: 90 tablet, Rfl: 0   TESTOSTERONE COMPOUNDING KIT TD, Place 1 application  onto the skin See admin instructions. 0.02%  0.2mg  /ml apply topically daily except on sundays, Disp: , Rfl:    Aspirin 81 MG CAPS, Take 81 mg by mouth every other day. (Patient not taking: Reported on 09/09/2022), Disp: , Rfl:    docusate sodium (COLACE) 100 MG capsule, Take 1 capsule (100 mg total) by mouth 2 (two) times daily as needed (take to keep stool soft.). (Patient not taking: Reported on 09/09/2022), Disp: 60 capsule, Rfl: 0   estradiol (ESTRACE VAGINAL) 0.1 MG/GM vaginal cream, Place 1 Applicatorful vaginally 3 (three) times a week. Use 1 small dolyp of cream on tip of  index finger and swap the inside of the vagina (Patient not taking: Reported on 04/08/2023), Disp: 42.5 g, Rfl: 1   ibuprofen (ADVIL) 200 MG tablet, Take 200 mg by mouth every 6 (six) hours as needed for moderate pain. (Patient not taking: Reported on 09/09/2022), Disp: , Rfl:    sulfamethoxazole-trimethoprim (BACTRIM DS) 800-160 MG tablet, Take 1 tablet by mouth 2 (two) times daily. Start taking one day prior to your appointment for your first follow-up and catheter removal.  Continue taking for three days. (Patient not taking: Reported on 09/09/2022), Disp: 6 tablet, Rfl: 0   traMADol (ULTRAM) 50 MG tablet, Take 1-2 tablets (50-100 mg total) by mouth every 6 (six) hours as needed for moderate pain. (Patient not taking: Reported on 09/09/2022), Disp: 15 tablet, Rfl: 0   Allergies  Allergen Reactions   Alendronate Sodium      Finger arthralgia   Meloxicam     bruising     Review of Systems  Constitutional: Negative.   Respiratory: Negative.    Cardiovascular: Negative.   Musculoskeletal: Negative.   Skin:  Positive for rash.       Also, she points out red spots on her arms and lower legs she recently noticed. She admits it looks a lot worse. She is not sure what may have contributed to her sx.   Neurological: Negative.   Psychiatric/Behavioral: Negative.       Today's Vitals   04/08/23 1540  BP: 130/84  Pulse: 93  Temp: 97.6 F (36.4 C)  SpO2: 98%  Weight: 158 lb 12.8 oz (72 kg)  Height: 5\' 6"  (1.676 m)   Body mass index is 25.63 kg/m.  Wt Readings from Last 3 Encounters:  04/08/23 158 lb 12.8 oz (72 kg)  09/09/22 154 lb 6.4 oz (70 kg)  05/16/22 150 lb (68 kg)    The ASCVD Risk score (Arnett DK, et al., 2019) failed to calculate for the following reasons:   The valid total cholesterol range is 130 to 320 mg/dL  Objective:  Physical Exam Vitals and nursing note reviewed.  Constitutional:      Appearance: Normal appearance.  HENT:     Head: Normocephalic and atraumatic.  Eyes:     Extraocular Movements: Extraocular movements intact.  Cardiovascular:     Rate and Rhythm: Normal rate and regular rhythm.     Heart sounds: Normal heart sounds.  Pulmonary:     Effort: Pulmonary effort is normal.     Breath sounds: Normal breath sounds.  Musculoskeletal:     Cervical back: Normal range of motion.  Skin:    General: Skin is warm.  Neurological:     General: No focal deficit present.     Mental Status: She is alert.  Psychiatric:        Mood and Affect: Mood normal.        Behavior: Behavior normal.                                           Assessment And Plan:  1. Female climacteric state Comments: I will decrease prog SR to 125/dhea 2.5, she agrees to virtual visit in four months. If this is tolerated, will stop DHEA.  She states she is UTD w/ mammogram.   2. Estrogen  deficiency Comments: She agrees to a bone density, prefers to be scheduled in [place]. Encouraged to engage in  weight-bearing exercises at least 3x/week. - DG Bone Density; Future  3. Macular erythematous rash Comments: I am not sure of the cause. She will let me know if sx persist. If due to an allergic reaction, should resolve with Benadryl/Zyrtec.  4. Body mass index (BMI) 25.0-25.9, adult Comments: She is encouraged to aim for at least 150 minutes of exercise/week.  5. Polypharmacy - CMP14+EGFR - CBC    Return for 6 month bhrt f/u.  Patient was given opportunity to ask questions. Patient verbalized understanding of the plan and was able to repeat key elements of the plan. All questions were answered to their satisfaction.   I, Gwynneth Aliment, MD, have reviewed all documentation for this visit. The documentation on 04/08/23 for the exam, diagnosis, procedures, and orders are all accurate and complete.   IF YOU HAVE BEEN REFERRED TO A SPECIALIST, IT MAY TAKE 1-2 WEEKS TO SCHEDULE/PROCESS THE REFERRAL. IF YOU HAVE NOT HEARD FROM US/SPECIALIST IN TWO WEEKS, PLEASE GIVE [place] A CALL AT [phone].

## 2023-04-09 LAB — CBC
Hematocrit: 38.7 % (ref 34.0–46.6)
Hemoglobin: 12.5 g/dL (ref 11.1–15.9)
MCH: 30.5 pg (ref 26.6–33.0)
MCHC: 32.3 g/dL (ref 31.5–35.7)
MCV: 94 fL (ref 79–97)
Platelets: 331 10*3/uL (ref 150–450)
RBC: 4.1 x10E6/uL (ref 3.77–5.28)
RDW: 13.1 % (ref 11.7–15.4)
WBC: 6 10*3/uL (ref 3.4–10.8)

## 2023-04-09 LAB — CMP14+EGFR
ALT: 18 IU/L (ref 0–32)
AST: 25 IU/L (ref 0–40)
Albumin/Globulin Ratio: 1.4 (ref 1.2–2.2)
Albumin: 3.8 g/dL — ABNORMAL LOW (ref 3.9–4.9)
Alkaline Phosphatase: 63 IU/L (ref 44–121)
BUN/Creatinine Ratio: 15 (ref 12–28)
BUN: 14 mg/dL (ref 8–27)
Bilirubin Total: 0.4 mg/dL (ref 0.0–1.2)
CO2: 23 mmol/L (ref 20–29)
Calcium: 9 mg/dL (ref 8.7–10.3)
Chloride: 108 mmol/L — ABNORMAL HIGH (ref 96–106)
Creatinine, Ser: 0.96 mg/dL (ref 0.57–1.00)
Globulin, Total: 2.8 g/dL (ref 1.5–4.5)
Glucose: 89 mg/dL (ref 70–99)
Potassium: 4 mmol/L (ref 3.5–5.2)
Sodium: 142 mmol/L (ref 134–144)
Total Protein: 6.6 g/dL (ref 6.0–8.5)
eGFR: 64 mL/min/{1.73_m2} (ref >59)

## 2023-04-13 LAB — SPECIMEN STATUS REPORT

## 2023-04-13 LAB — TSH: TSH: 1.73 u[IU]/mL (ref 0.450–4.500)

## 2023-04-20 ENCOUNTER — Ambulatory Visit: Payer: Medicare Other

## 2023-04-28 ENCOUNTER — Other Ambulatory Visit: Payer: Self-pay | Admitting: Internal Medicine

## 2023-05-05 ENCOUNTER — Ambulatory Visit
Admission: RE | Admit: 2023-05-05 | Discharge: 2023-05-05 | Disposition: A | Discharge status: Home / Self Care | Payer: Medicare Other | Source: Ambulatory Visit | Attending: Family Medicine | Admitting: Family Medicine

## 2023-05-05 DIAGNOSIS — Z1231 Encounter for screening mammogram for malignant neoplasm of breast: Secondary | ICD-10-CM

## 2023-06-25 ENCOUNTER — Other Ambulatory Visit: Payer: Self-pay | Admitting: Cardiology

## 2023-09-12 ENCOUNTER — Other Ambulatory Visit: Payer: Self-pay | Admitting: Cardiology

## 2023-09-12 DIAGNOSIS — E785 Hyperlipidemia, unspecified: Secondary | ICD-10-CM

## 2023-10-09 ENCOUNTER — Other Ambulatory Visit: Payer: Self-pay | Admitting: Cardiology

## 2023-10-09 DIAGNOSIS — E785 Hyperlipidemia, unspecified: Secondary | ICD-10-CM

## 2023-10-12 ENCOUNTER — Encounter: Payer: Self-pay | Admitting: Internal Medicine

## 2023-10-12 ENCOUNTER — Telehealth: Payer: Medicare Other | Admitting: Internal Medicine

## 2023-10-12 VITALS — Ht 66.0 in | Wt 158.0 lb

## 2023-10-12 DIAGNOSIS — N951 Menopausal and female climacteric states: Secondary | ICD-10-CM

## 2023-10-12 DIAGNOSIS — N819 Female genital prolapse, unspecified: Secondary | ICD-10-CM

## 2023-10-12 DIAGNOSIS — K4090 Unilateral inguinal hernia, without obstruction or gangrene, not specified as recurrent: Secondary | ICD-10-CM

## 2023-10-12 DIAGNOSIS — E2839 Other primary ovarian failure: Secondary | ICD-10-CM

## 2023-10-12 NOTE — Progress Notes (Signed)
Virtual Visit via Video Note  I,Jameka J Llittleton, CMA,acting as a scribe for Gwynneth Aliment, MD.,have documented all relevant documentation on the behalf of Gwynneth Aliment, MD,as directed by  Gwynneth Aliment, MD while in the presence of Gwynneth Aliment, MD.  I connected with Andrea Mendoza on 10/25/23 at  3:40 PM EST by a video enabled telemedicine application and verified that I am speaking with the correct person using two identifiers.  Patient Location: Home Provider Location: Office/Clinic  I discussed the limitations, risks, security, and privacy concerns of performing an evaluation and management service by video and the availability of in person appointments. I also discussed with the patient that there may be a patient responsible charge related to this service. The patient expressed understanding and agreed to proceed.  Subjective: PCP: Joycelyn Rua, MD  Chief Complaint  Patient presents with   [facility]   She presents today for virtual f/u BHRT. She feels well on her current regimen. However, she has been seen by UroGYN in [place] for pelvic prolapse. She has been evaluated for possible Sacrocolpopexy in the future. Of note, she had same procedure performed July 2023 in [place].   She denies having headaches, cp and sob. She has yet to establish with PCP in [place]; however, she has seen specialists.      ROS: Per HPI  Current Outpatient Medications:    Calcium Carb-Cholecalciferol (CALCIUM + D3 PO), Take 1 tablet by mouth daily., Disp: , Rfl:    Cholecalciferol (VITAMIN D3) 50 MCG (2000 UT) capsule, Take 2,000 Units by mouth daily., Disp: , Rfl:    denosumab (PROLIA) 60 MG/ML SOSY injection, Inject 60 mg into the skin every 6 (six) months., Disp: , Rfl:    estradiol (ESTRACE VAGINAL) 0.1 MG/GM vaginal cream, Place 1 Applicatorful vaginally 3 (three) times a week. Use 1 small dolyp of cream on tip of index finger and swap the inside of the vagina, Disp: 42.5 g, Rfl:  1   Estradiol-Estriol-Progesterone (BIEST/PROGESTERONE) CREA, Place 1 application  onto the skin every Monday, Tuesday, Wednesday, Thursday, and Friday. 0.2(0.16-0.04) mg/ml, Disp: , Rfl:    ezetimibe (ZETIA) 10 MG tablet, Take 1 tablet by mouth once daily, Disp: 30 tablet, Rfl: 0   ibuprofen (ADVIL) 200 MG tablet, Take 200 mg by mouth every 6 (six) hours as needed for moderate pain (pain score 4-6)., Disp: , Rfl:    metoprolol tartrate (LOPRESSOR) 25 MG tablet, TAKE 1/2 (ONE-HALF) TABLET BY MOUTH AS NEEDED (Patient taking differently: 12.5 mg.), Disp: 45 tablet, Rfl: 3   Progesterone Micronized (PROGESTERONE PO), Take 1 capsule by mouth See admin instructions. 150 mg progesterone and 5 mg dhea. Take 1 capsule Monday through Saturday, skip dose Sundays, Disp: , Rfl:    rosuvastatin (CRESTOR) 40 MG tablet, TAKE 1 TABLET BY MOUTH ONCE DAILY . APPOINTMENT REQUIRED FOR FUTURE REFILLS, Disp: 90 tablet, Rfl: 3   TESTOSTERONE COMPOUNDING KIT TD, Place 1 application  onto the skin See admin instructions. 0.02%  0.2mg  /ml apply topically daily on Monday, Wednesday and friday, Disp: , Rfl:   Observations/Objective: Today's Vitals   10/12/23 1356  Weight: 158 lb (71.7 kg)  Height: 5\' 6"  (1.676 m)   Physical Exam Vitals and nursing note reviewed.  Constitutional:      Appearance: Normal appearance.  HENT:     Head: Normocephalic and atraumatic.  Eyes:     Extraocular Movements: Extraocular movements intact.  Cardiovascular:     Rate and Rhythm:  Normal rate and regular rhythm.     Heart sounds: Normal heart sounds.  Pulmonary:     Effort: Pulmonary effort is normal.  Musculoskeletal:     Cervical back: Normal range of motion.  Skin:    General: Skin is warm.  Neurological:     General: No focal deficit present.     Mental Status: She is alert.  Psychiatric:        Mood and Affect: Mood normal.        Behavior: Behavior normal.     Assessment and Plan: Female climacteric state Assessment  & Plan: Continue with Biest, progesterone and stop DHEA. Continue with testosterone - using 3 nights/weekly. Refills called into [facility] immediately after our call. She agrees to f/u in 3-4 months for re-evaluation.    Estrogen deficiency Assessment & Plan: Bone density ordered at last visit, she is encouraged to schedule at her earliest convenience. She is encouraged to engage in weight-bearing exercises at least 3 days per week.    Female genital prolapse, unspecified type Assessment & Plan: Chronic. Unfortunately, she is now having complications from her July 2023 procedure. She is not yet sure if she will proceed with intervention by [facility]. Notes reviewed in Care Everywhere.      Follow Up Instructions: Return in about 4 months (around 02/10/2024), or bhrt f/u.   I discussed the assessment and treatment plan with the patient. The patient was provided an opportunity to ask questions, and all were answered. The patient agreed with the plan and demonstrated an understanding of the instructions.   The patient was advised to call back or seek an in-person evaluation if the symptoms worsen or if the condition fails to improve as anticipated.  The above assessment and management plan was discussed with the patient. The patient verbalized understanding of and has agreed to the management plan.   I, Gwynneth Aliment, MD, have reviewed all documentation for this visit. The documentation on 10/12/23 for the exam, diagnosis, procedures, and orders are all accurate and complete.

## 2023-10-12 NOTE — Assessment & Plan Note (Addendum)
Continue with Biest, progesterone and stop DHEA. Continue with testosterone - using 3 nights/weekly. Refills called into [facility] pharmacy immediately after our call. She agrees to f/u in 3-4 months for re-evaluation.

## 2023-10-12 NOTE — Assessment & Plan Note (Addendum)
Bone density ordered at last visit, she is encouraged to schedule at her earliest convenience. She is encouraged to engage in weight-bearing exercises at least 3 days per week.

## 2023-10-25 NOTE — Assessment & Plan Note (Addendum)
Chronic. Unfortunately, she is now having complications from her July 2023 procedure. She is not yet sure if she will proceed with intervention by [facility]. Notes reviewed in Care Everywhere.

## 2023-10-25 NOTE — Patient Instructions (Signed)
Bone Health Bones protect organs, store calcium, anchor muscles, and support the whole body. Keeping your bones strong is important, especially as you get older. You can take actions to help keep your bones strong and healthy. Why is keeping my bones healthy important?  Keeping your bones healthy is important because your body constantly replaces bone cells. Cells get old, and new cells take their place. As we age, we lose bone cells because the body may not be able to make enough new cells to replace the old cells. The amount of bone cells and bone tissue you have is referred to as bone mass. The higher your bone mass, the stronger your bones. The aging process leads to an overall loss of bone mass in the body, which can increase the likelihood of: Broken bones. A condition in which the bones become weak and brittle (osteoporosis). A large decline in bone mass occurs in older adults. In women, it occurs about the time of menopause. What actions can I take to keep my bones healthy? Good health habits are important for maintaining healthy bones. This includes eating nutritious foods and exercising regularly. To have healthy bones, you need to get enough of the right minerals and vitamins. Most nutrition experts recommend getting these nutrients from the foods that you eat. In some cases, taking supplements may also be recommended. Doing certain types of exercise is also important for bone health. What are the nutritional recommendations for healthy bones?  Eating a well-balanced diet with plenty of calcium and vitamin D will help to protect your bones. Nutritional recommendations vary from person to person. Ask your health care provider what is healthy for you. Here are some general guidelines. Get enough calcium Calcium is the most important (essential) mineral for bone health. Most people can get enough calcium from their diet, but supplements may be recommended for people who are at risk for  osteoporosis. Good sources of calcium include: Dairy products, such as low-fat or nonfat milk, cheese, and yogurt. Dark green leafy vegetables, such as bok choy and broccoli. Foods that have calcium added to them (are fortified). Foods that may be fortified with calcium include orange juice, cereal, bread, soy beverages, and tofu products. Nuts, such as almonds. Follow these recommended amounts for daily calcium intake: Infants, 0-6 months: 200 mg. Infants, 6-12 months: 260 mg. Children, age 1-3: 700 mg. Children, age 4-8: 1,000 mg. Children, age 9-13: 1,300 mg. Teens, age 14-18: 1,300 mg. Adults, age 19-50: 1,000 mg. Adults, age 51-70: Men: 1,000 mg. Women: 1,200 mg. Adults, age 71 or older: 1,200 mg. Pregnant and breastfeeding females: Teens: 1,300 mg. Adults: 1,000 mg. Get enough vitamin D Vitamin D is the most essential vitamin for bone health. It helps the body absorb calcium. Sunlight stimulates the skin to make vitamin D, so be sure to get enough sunlight. If you live in a cold climate or you do not get outside often, your health care provider may recommend that you take vitamin D supplements. Good sources of vitamin D in your diet include: Egg yolks. Saltwater fish. Milk and cereal fortified with vitamin D. Follow these recommended amounts for daily vitamin D intake: Infants, 0-12 months: 400 international units (IU). Children and teens, age 1-18: 600 international units. Adults, age 59 or younger: 600 international units. Adults, age 60 or older: 600-1,000 international units. Get other important nutrients Other nutrients that are important for bone health include: Phosphorus. This mineral is found in meat, poultry, dairy foods, nuts, and legumes. The   recommended daily intake for adult men and adult women is 700 mg. Magnesium. This mineral is found in seeds, nuts, dark green vegetables, and legumes. The recommended daily intake for adult men is 400-420 mg. For adult women,  it is 310-320 mg. Vitamin K. This vitamin is found in green leafy vegetables. The recommended daily intake is 120 mcg for adult men and 90 mcg for adult women. What type of physical activity is best for building and maintaining healthy bones? Weight-bearing and strength-building activities are important for building and maintaining healthy bones. Weight-bearing activities cause muscles and bones to work against gravity. Strength-building activities increase the strength of the muscles that support bones. Weight-bearing and muscle-building activities include: Walking and hiking. Jogging and running. Dancing. Gym exercises. Lifting weights. Tennis and racquetball. Climbing stairs. Aerobics. Adults should get at least 30 minutes of moderate physical activity on most days. Children should get at least 60 minutes of moderate physical activity on most days. Ask your health care provider what type of exercise is best for you. How can I find out if my bone mass is low? Bone mass can be measured with an X-ray test called a bone mineral density (BMD) test. This test is recommended for all women who are age 65 or older. It may also be recommended for: Men who are age 70 or older. People who are at risk for osteoporosis because of: Having a long-term disease that weakens bones, such as kidney disease or rheumatoid arthritis. Having menopause earlier than normal. Taking medicine that weakens bones, such as steroids, thyroid hormones, or hormone treatment for breast cancer or prostate cancer. Smoking. Drinking three or more alcoholic drinks a day. Being underweight. Sedentary lifestyle. If you find that you have a low bone mass, you may be able to prevent osteoporosis or further bone loss by changing your diet and lifestyle. Where can I find more information? Bone Health & Osteoporosis Foundation: www.nof.org/patients National Institutes of Health: www.bones.nih.gov International Osteoporosis  Foundation: www.iofbonehealth.org Summary The aging process leads to an overall loss of bone mass in the body, which can increase the likelihood of broken bones and osteoporosis. Eating a well-balanced diet with plenty of calcium and vitamin D will help to protect your bones. Weight-bearing and strength-building activities are also important for building and maintaining strong bones. Bone mass can be measured with an X-ray test called a bone mineral density (BMD) test. This information is not intended to replace advice given to you by your health care provider. Make sure you discuss any questions you have with your health care provider. Document Revised: 04/03/2021 Document Reviewed: 04/03/2021 Elsevier Patient Education  2024 Elsevier Inc.  

## 2023-11-16 ENCOUNTER — Telehealth: Payer: Self-pay | Admitting: Cardiology

## 2023-11-16 DIAGNOSIS — E785 Hyperlipidemia, unspecified: Secondary | ICD-10-CM

## 2023-11-16 MED ORDER — METOPROLOL TARTRATE 25 MG PO TABS: 12.5000 mg | ORAL_TABLET | Freq: Every day | ORAL | 3 refills | Status: AC | PRN

## 2023-11-16 MED ORDER — EZETIMIBE 10 MG PO TABS: 10.0000 mg | ORAL_TABLET | Freq: Every day | ORAL | 3 refills | Status: DC

## 2023-11-16 NOTE — Telephone Encounter (Signed)
 Pt's medications were sent to pt's pharmacy as requested. Confirmation received.

## 2023-11-16 NOTE — Telephone Encounter (Signed)
*  STAT* If patient is at the pharmacy, call can be transferred to refill team.   1. Which medications need to be refilled? (please list name of each medication and dose if known) ezetimibe  (ZETIA ) 10 MG tablet   metoprolol  tartrate (LOPRESSOR ) 25 MG tablet     2. Would you like to learn more about the convenience, safety, & potential cost savings by using the [facility]?    3. Are you open to using the [facility] (Type [facility].    4. Which pharmacy/location (including street and city if local pharmacy) is medication to be sent to? [facility] [address] [place], [place] [address] Phone: [phone] Point of contact if needed ; [facility] ([facility])   5. Do they need a 30 day or 90 day supply? 90 days   Pt have new pharmacy, also, she need 90 days supply. Per Dr. Denver last OV notes(09/09/22) her follow-up is in 2 years November 202. Calendar is not open that far yet.

## 2024-02-08 ENCOUNTER — Ambulatory Visit: Payer: Medicare Other | Admitting: Internal Medicine

## 2024-04-06 IMAGING — MG MM DIGITAL SCREENING BILAT W/ TOMO AND CAD
8 series · 8 of 24 positions shown · non-contrast
Comparison: Previous exam(s).

CLINICAL DATA: Screening.

EXAM:
DIGITAL SCREENING BILATERAL MAMMOGRAM WITH TOMOSYNTHESIS AND CAD
TECHNIQUE: Bilateral screening digital craniocaudal and mediolateral oblique
mammograms were obtained. Bilateral screening digital breast
tomosynthesis was performed. The images were evaluated with
computer-aided detection.

[R CC synth-2D]
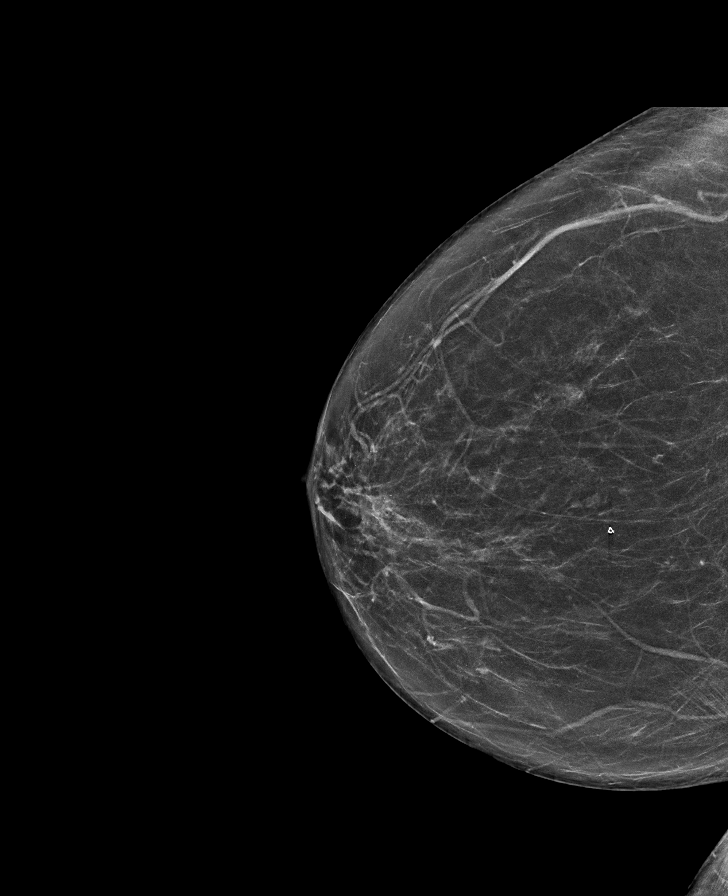

[R MLO synth-2D]
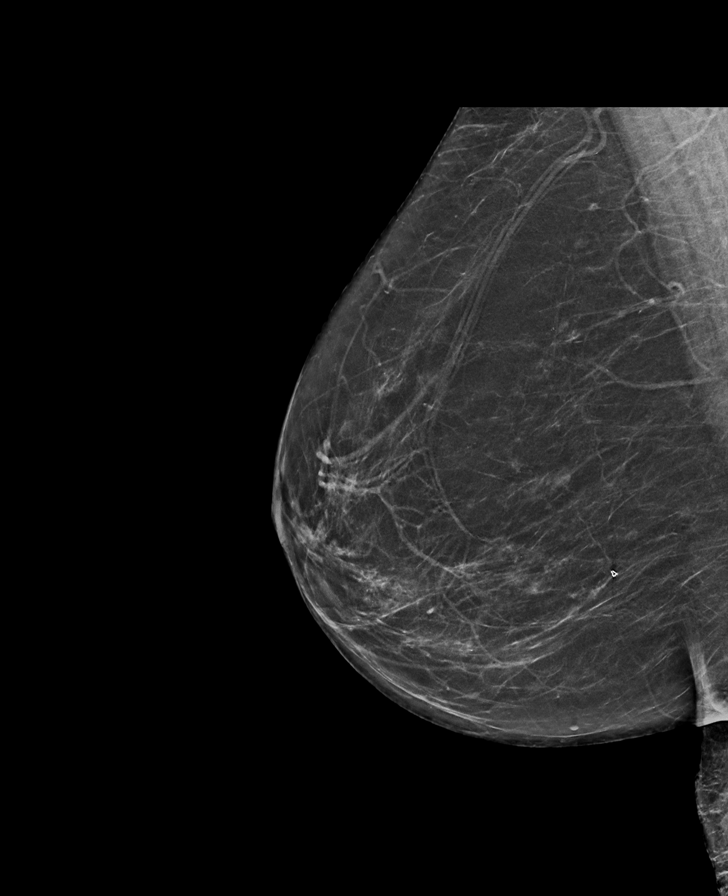

[L MLO synth-2D]
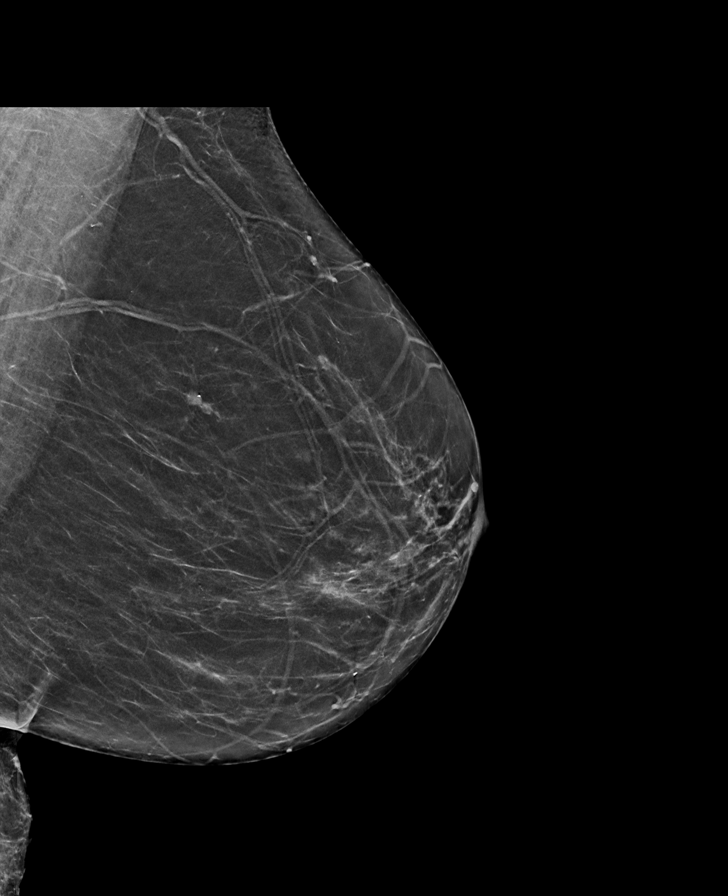

[L CC synth-2D]
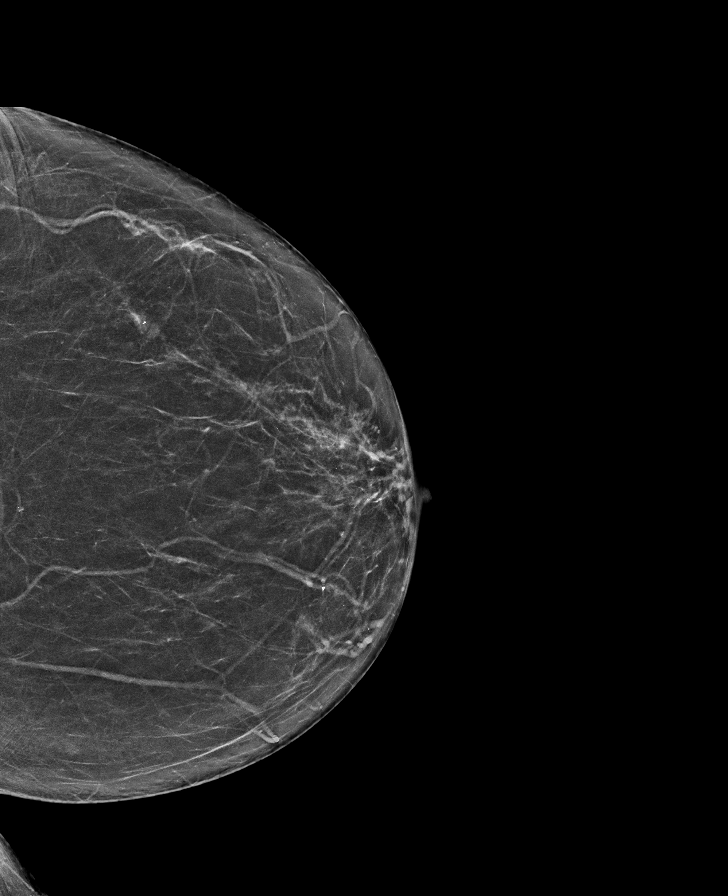

[L CC tomo · tomo slice 33/65.0]
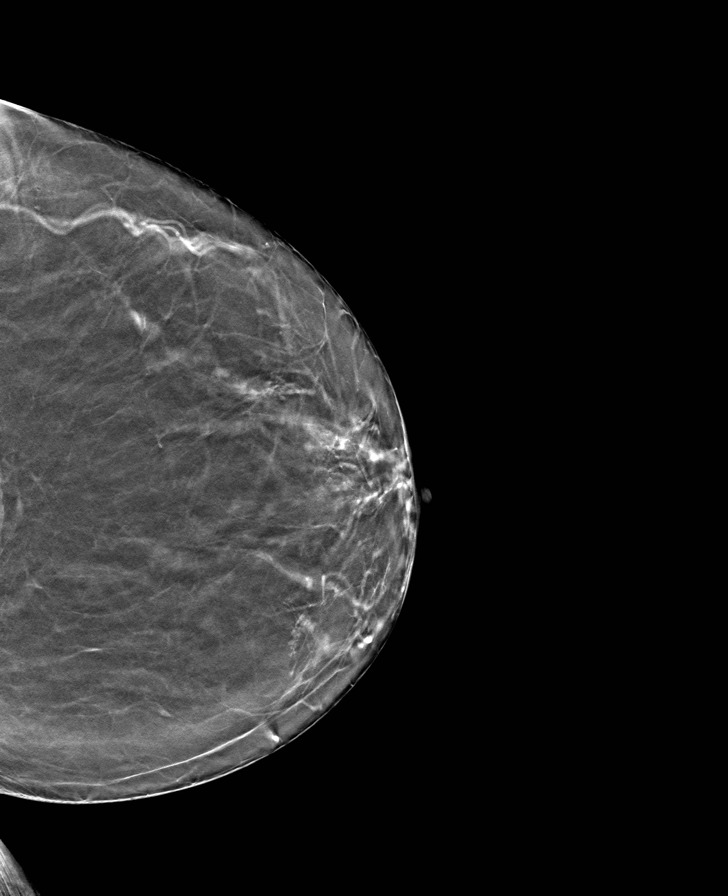

[R MLO tomo · tomo slice 34/67.0]
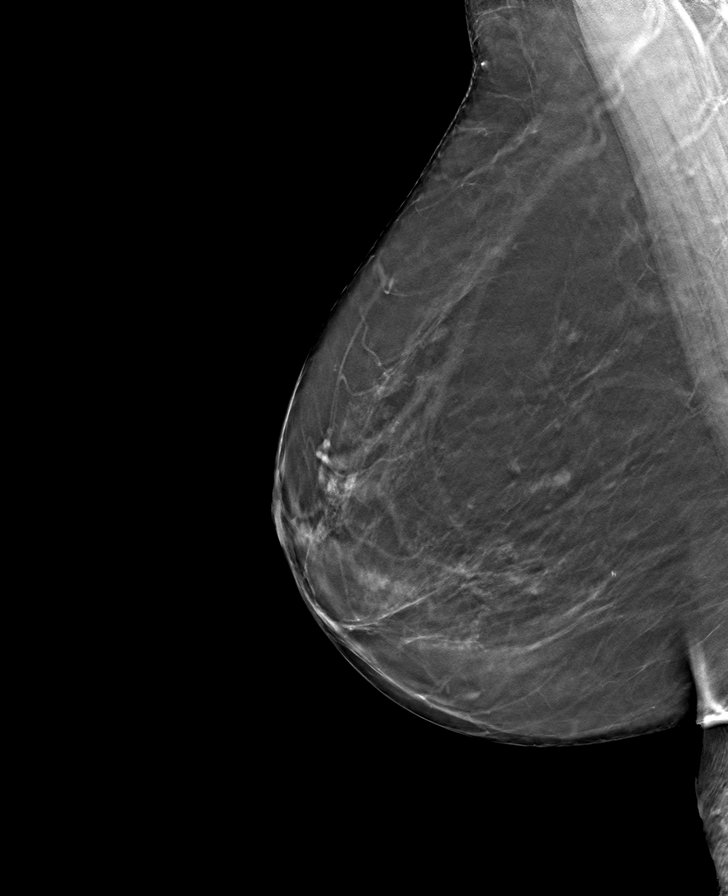

[L MLO tomo · tomo slice 33/65.0]
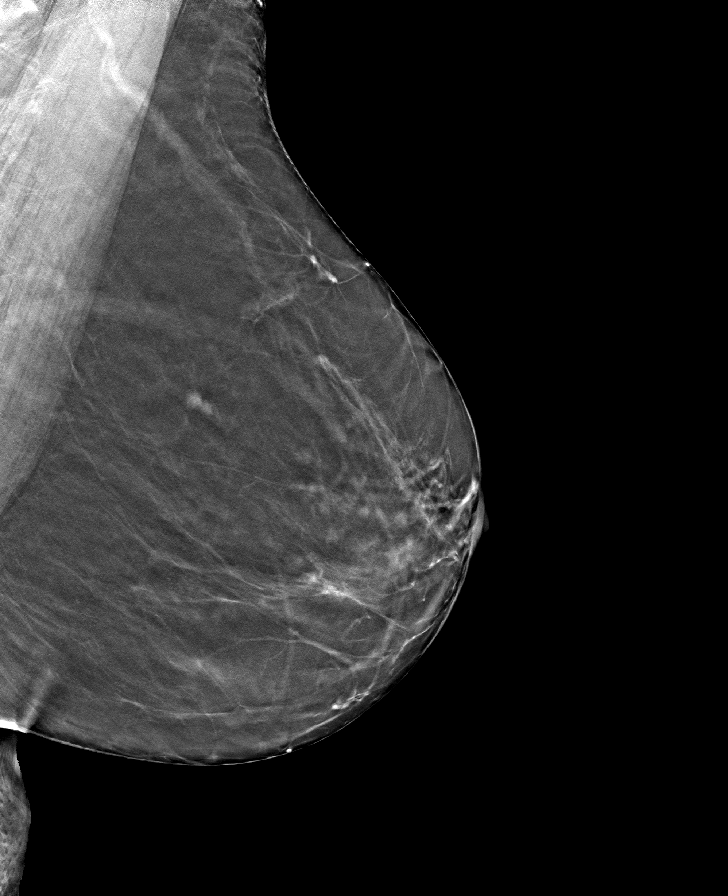

[R CC tomo · tomo slice 34/67.0]
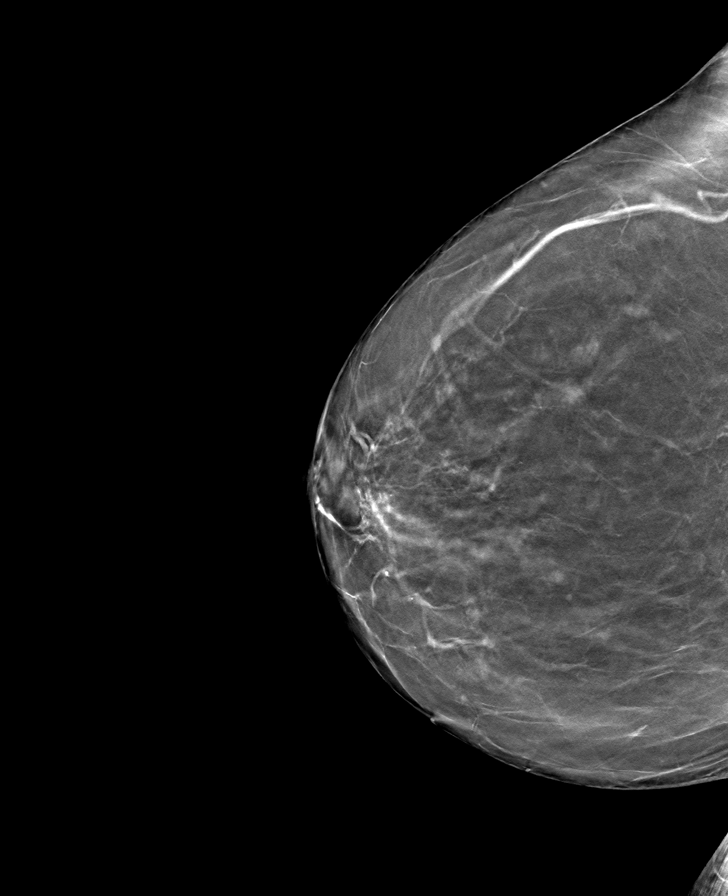

[8 of 24 positions shown; findings below may reference images not displayed]

ACR Breast Density Category b: There are scattered areas of
fibroglandular density.
FINDINGS: There are no findings suspicious for malignancy.
IMPRESSION: No mammographic evidence of malignancy. A result letter of this
screening mammogram will be mailed directly to the patient.

RECOMMENDATION:
Screening mammogram in one year. (Code:51-O-LD2)

BI-RADS CATEGORY  1: Negative.

## 2024-04-07 ENCOUNTER — Encounter: Payer: Self-pay | Admitting: Internal Medicine

## 2024-04-20 ENCOUNTER — Ambulatory Visit: Payer: Self-pay | Admitting: Internal Medicine

## 2024-04-20 ENCOUNTER — Encounter: Payer: Self-pay | Admitting: Internal Medicine

## 2024-04-20 ENCOUNTER — Telehealth: Admitting: Internal Medicine

## 2024-04-20 DIAGNOSIS — M816 Localized osteoporosis [Lequesne]: Secondary | ICD-10-CM | POA: Diagnosis not present

## 2024-04-20 DIAGNOSIS — N951 Menopausal and female climacteric states: Secondary | ICD-10-CM

## 2024-04-20 DIAGNOSIS — E2839 Other primary ovarian failure: Secondary | ICD-10-CM

## 2024-04-20 NOTE — Progress Notes (Signed)
 Virtual Visit via Video Note  I,Jameka J Llittleton, CMA,acting as a scribe for Catheryn LOISE Slocumb, MD.,have documented all relevant documentation on the behalf of Catheryn LOISE Slocumb, MD,as directed by  Catheryn LOISE Slocumb, MD while in the presence of Catheryn LOISE Slocumb, MD.  I connected with Andrea Mendoza on 05/01/24 at  4:20 PM EDT by a video enabled telemedicine application and verified that I am speaking with the correct person using two identifiers.  Patient Location: Home Provider Location: Office/Clinic  I discussed the limitations, risks, security, and privacy concerns of performing an evaluation and management service by video and the availability of in person appointments. I also discussed with the patient that there may be a patient responsible charge related to this service. The patient expressed understanding and agreed to proceed.  Subjective: PCP: Nanci Senior, MD  No chief complaint on file. Discussed the use of AI scribe software for clinical note transcription with the patient, who gave verbal consent to proceed.  History of Present Illness She is a 68 year old female who presents for follow-up on hormone therapy management.  She stopped taking DHEA in December and has been using testosterone  three nights a week. She experiences increased anxiety and sleep disturbances since the last medication adjustment. Her sleep is variable, with some nights being better than others, and she notes that a seven-hour night is ideal. She is not currently taking any supplements like melatonin or magnesium for sleep.  She has been using Prolia every six months for bone health, and her recent bone density results show improvement, with the left femur improving at a slower rate. The bone density test was conducted in [place], [place].  She is preparing for surgery next week to address a hernia that developed after a previous surgery. This will be performed by Dr. Fern and Dr. Fayette, her  urogynecologist. She has switched to vaginal estrogen from [facility] as per her Urogyn specialist.  She is out of testosterone  and has a few weeks' supply of progesterone left.  She has not yet established care with a primary care provider in her new location and is still transitioning her medical care from [facility] to her current location.  ROS: Per HPI  Current Outpatient Medications:    Calcium  Carb-Cholecalciferol (CALCIUM  + D3 PO), Take 1 tablet by mouth daily., Disp: , Rfl:    Cholecalciferol (VITAMIN D3) 50 MCG (2000 UT) capsule, Take 2,000 Units by mouth daily., Disp: , Rfl:    denosumab (PROLIA) 60 MG/ML SOSY injection, Inject 60 mg into the skin every 6 (six) months., Disp: , Rfl:    estradiol  (ESTRACE  VAGINAL) 0.1 MG/GM vaginal cream, Place 1 Applicatorful vaginally 3 (three) times a week. Use 1 small dolyp of cream on tip of index finger and swap the inside of the vagina, Disp: 42.5 g, Rfl: 1   Estradiol -Estriol-Progesterone (BIEST/PROGESTERONE) CREA, Place 1 application  onto the skin every Monday, Tuesday, Wednesday, Thursday, and Friday. 0.2(0.16-0.04) mg/ml, Disp: , Rfl:    ezetimibe  (ZETIA ) 10 MG tablet, Take 1 tablet (10 mg total) by mouth daily., Disp: 90 tablet, Rfl: 3   ibuprofen (ADVIL) 200 MG tablet, Take 200 mg by mouth every 6 (six) hours as needed for moderate pain (pain score 4-6)., Disp: , Rfl:    metoprolol  tartrate (LOPRESSOR ) 25 MG tablet, Take 0.5 tablets (12.5 mg total) by mouth daily as needed., Disp: 45 tablet, Rfl: 3   Progesterone Micronized (PROGESTERONE PO), Take 1 capsule by mouth See admin  instructions. 150 mg progesterone and 5 mg dhea. Take 1 capsule Monday through Saturday, skip dose Sundays, Disp: , Rfl:    rosuvastatin  (CRESTOR ) 40 MG tablet, TAKE 1 TABLET BY MOUTH ONCE DAILY . APPOINTMENT REQUIRED FOR FUTURE REFILLS, Disp: 90 tablet, Rfl: 3   TESTOSTERONE  COMPOUNDING KIT TD, Place 1 application  onto the skin See admin instructions. 0.02%  0.2mg  /ml apply  topically daily on Monday, Wednesday and friday, Disp: , Rfl:   Observations/Objective: There were no vitals filed for this visit. Physical Exam Vitals and nursing note reviewed.  Constitutional:      Appearance: Normal appearance.  HENT:     Head: Normocephalic and atraumatic.   Eyes:     Extraocular Movements: Extraocular movements intact.   Pulmonary:     Effort: Pulmonary effort is normal.   Musculoskeletal:     Cervical back: Normal range of motion.   Neurological:     Mental Status: She is alert.   Psychiatric:        Mood and Affect: Mood normal.     Assessment and Plan: Female climacteric state Assessment & Plan: On topical testosterone  and progesterone. DHEA discontinued. Reports anxiety and sleep disturbances, possibly due to hormone changes or upcoming surgery. Magnesium glycinate recommended for anxiety and sleep issues post-surgery. - Continue progesterone, hold day before surgery. - continue with vaginal estradiol  per UroGyn - Hold testosterone  until post-surgery reassessment. - Consider magnesium glycinate post-surgery. - Reassess hormone therapy a few weeks post-surgery. - Follow up in four months, virtual appt   Localized osteoporosis without current pathological fracture Assessment & Plan: Using Prolia biannually. Bone density shows improvement, spine nearing normal, left femur slower improvement.    FAILED VIDEO VISIT: 11 min 34 seconds Follow Up Instructions: Return for 4 month BHRT-- virtual ok.   I discussed the assessment and treatment plan with the patient. The patient was provided an opportunity to ask questions, and all were answered. The patient agreed with the plan and demonstrated an understanding of the instructions.   The patient was advised to call back or seek an in-person evaluation if the symptoms worsen or if the condition fails to improve as anticipated.  The above assessment and management plan was discussed with the patient.  The patient verbalized understanding of and has agreed to the management plan.   I, Catheryn LOISE Slocumb, MD, have reviewed all documentation for this visit. The documentation on 04/20/24 for the exam, diagnosis, procedures, and orders are all accurate and complete.

## 2024-05-01 DIAGNOSIS — M816 Localized osteoporosis [Lequesne]: Secondary | ICD-10-CM | POA: Insufficient documentation

## 2024-05-01 NOTE — Assessment & Plan Note (Signed)
 On topical testosterone  and progesterone. DHEA discontinued. Reports anxiety and sleep disturbances, possibly due to hormone changes or upcoming surgery. Magnesium glycinate recommended for anxiety and sleep issues post-surgery. - Continue progesterone, hold day before surgery. - continue with vaginal estradiol  per UroGyn - Hold testosterone  until post-surgery reassessment. - Consider magnesium glycinate post-surgery. - Reassess hormone therapy a few weeks post-surgery. - Follow up in four months, virtual appt

## 2024-05-01 NOTE — Assessment & Plan Note (Signed)
 Using Prolia biannually. Bone density shows improvement, spine nearing normal, left femur slower improvement.

## 2024-05-03 ENCOUNTER — Telehealth: Payer: Self-pay | Admitting: Cardiology

## 2024-05-03 MED ORDER — ROSUVASTATIN CALCIUM 40 MG PO TABS: 40.0000 mg | ORAL_TABLET | Freq: Every day | ORAL | 0 refills | Status: DC

## 2024-05-03 NOTE — Telephone Encounter (Signed)
 Pt's medication was sent to pt's pharmacy as requested. Confirmation received.

## 2024-05-03 NOTE — Telephone Encounter (Signed)
*  STAT* If patient is at the pharmacy, call can be transferred to refill team.   1. Which medications need to be refilled? (please list name of each medication and dose if known)  rosuvastatin  (CRESTOR ) 40 MG tablet   2. Which pharmacy/location (including street and city if local pharmacy) is medication to be sent to? [facility] [id number] - [place], [place] - [address]   3. Do they need a 30 day or 90 day supply?  90 day supply

## 2024-08-01 ENCOUNTER — Other Ambulatory Visit: Payer: Self-pay | Admitting: Cardiology

## 2024-08-09 ENCOUNTER — Other Ambulatory Visit: Payer: Self-pay | Admitting: Family Medicine

## 2024-08-09 DIAGNOSIS — Z1231 Encounter for screening mammogram for malignant neoplasm of breast: Secondary | ICD-10-CM

## 2024-08-17 ENCOUNTER — Ambulatory Visit: Admitting: Cardiology

## 2024-08-24 ENCOUNTER — Telehealth: Admitting: Internal Medicine

## 2024-08-24 NOTE — Progress Notes (Signed)
 Erroneous

## 2024-08-29 NOTE — Progress Notes (Signed)
 This encounter was created in error - please disregard.

## 2024-09-14 ENCOUNTER — Ambulatory Visit
Admission: RE | Admit: 2024-09-14 | Discharge: 2024-09-14 | Disposition: A | Discharge status: Home / Self Care | Source: Ambulatory Visit | Attending: Family Medicine | Admitting: Family Medicine

## 2024-09-14 DIAGNOSIS — Z1231 Encounter for screening mammogram for malignant neoplasm of breast: Secondary | ICD-10-CM

## 2024-09-14 NOTE — Progress Notes (Signed)
  Cardiology Office Note:   Date:  09/15/2024  ID:  Andrea Mendoza, DOB [birth year], MRN [id number] PCP: Andrea Senior, MD  [facility] Providers Cardiologist:  Andrea Schilling, MD Electrophysiologist:  Andrea Gladis Norton, MD {  History of Present Illness:   Andrea Mendoza is a 68 y.o. female  who presents for evaluation of SVT and coronary calcium .  She had a negative POET (Plain Old Exercise Treadmill) in July 2021.  She lives in [place] now.  Since I last saw her she has done well.  She has had lots of gynecologic and urologic problems and has required a few surgeries and so she has not been able to be as active.  She has not had any cardiovascular complications with surgeries obviously.  The patient denies any new symptoms such as chest discomfort, neck or arm discomfort. There has been no new shortness of breath, PND or orthopnea. There have been no reported palpitations, presyncope or syncope.   ROS: As stated in the HPI and negative for all other systems.  Studies Reviewed:    EKG:   EKG Interpretation Date/Time:  Thursday September 15 2024 13:28:40 EST Ventricular Rate:  88 PR Interval:  156 QRS Duration:  86 QT Interval:  394 QTC Calculation: 476 R Axis:   -19  Text Interpretation: Unusual P axis, possible ectopic atrial rhythm Minimal voltage criteria for LVH, may be normal variant ( R in aVL ) Possible Anterior infarct (cited on or before 16-May-2022) When compared with ECG of 16-May-2022 08:35, Ectopic atrial rhythm has replaced Sinus rhythm Confirmed by Mendoza Andrea ([id number]) on 09/15/2024 2:01:09 PM   Risk Assessment/Calculations:              Physical Exam:   VS:  BP 130/80   Pulse 90   Ht 5' 6 (1.676 m)   Wt 156 lb (70.8 kg)   SpO2 95%   BMI 25.18 kg/m    Wt Readings from Last 3 Encounters:  09/15/24 156 lb (70.8 kg)  10/12/23 158 lb (71.7 kg)  04/08/23 158 lb 12.8 oz (72 kg)     GEN: Well nourished, well developed in no acute distress NECK: No  JVD; No carotid bruits CARDIAC: RRR, no murmurs, rubs, gallops RESPIRATORY:  Clear to auscultation without rales, wheezing or rhonchi  ABDOMEN: Soft, non-tender, non-distended EXTREMITIES:  No edema; No deformity   ASSESSMENT AND PLAN:   AVNRT:    The patient has had no recurrent tachypalpitations.  No further workup is indicated.   DYSLIPIDEMIA:   LDL is LDL was 71 with HDL of 59.  This is a reasonable ratio though I would not want her LDL to be higher than this and we talked about this goal.  She Andrea remain on the meds as listed.   ELEVATED CORONARY CALCIUM  SCORE:   She has had no symptoms.  We talked a lot about paying attention to symptoms and primary risk reduction.   HTN:    Her blood pressure is upper limits.  I would not want it to be higher than this and she and I talked about the goals of therapy.  No change in therapy.  ABNORMAL EKG: She did have a possible ectopic atrial rhythm but this is an incidental finding and not clinically significant.  No further workup.  Follow up with she Andrea likely be finding a cardiologist in [place] since she lives there.me as needed.    Signed, Andrea Schilling, MD

## 2024-09-15 ENCOUNTER — Encounter: Payer: Self-pay | Admitting: Cardiology

## 2024-09-15 ENCOUNTER — Ambulatory Visit: Attending: Cardiology | Admitting: Cardiology

## 2024-09-15 VITALS — BP 130/80 | HR 90 | Ht 66.0 in | Wt 156.0 lb

## 2024-09-15 DIAGNOSIS — I4719 Other supraventricular tachycardia: Secondary | ICD-10-CM | POA: Insufficient documentation

## 2024-09-15 DIAGNOSIS — R931 Abnormal findings on diagnostic imaging of heart and coronary circulation: Secondary | ICD-10-CM | POA: Diagnosis present

## 2024-09-15 DIAGNOSIS — I10 Essential (primary) hypertension: Secondary | ICD-10-CM | POA: Diagnosis present

## 2024-09-15 DIAGNOSIS — E785 Hyperlipidemia, unspecified: Secondary | ICD-10-CM | POA: Insufficient documentation

## 2024-09-15 NOTE — Patient Instructions (Signed)

## 2024-11-18 ENCOUNTER — Other Ambulatory Visit: Payer: Self-pay | Admitting: Cardiology

## 2024-11-18 DIAGNOSIS — E785 Hyperlipidemia, unspecified: Secondary | ICD-10-CM
